# Patient Record
Sex: Female | Born: 1935 | Race: Black or African American | Hispanic: No | Smoking: Never smoker
Health system: Southern US, Community
[De-identification: ages and names within clinical notes are randomized; demographics above are authoritative.]

## PROBLEM LIST (undated history)

## (undated) DIAGNOSIS — E785 Hyperlipidemia, unspecified: Secondary | ICD-10-CM

## (undated) DIAGNOSIS — F0390 Unspecified dementia without behavioral disturbance: Secondary | ICD-10-CM

## (undated) DIAGNOSIS — K219 Gastro-esophageal reflux disease without esophagitis: Secondary | ICD-10-CM

## (undated) HISTORY — DX: Gastro-esophageal reflux disease without esophagitis: K21.9

## (undated) HISTORY — PX: CHOLECYSTECTOMY: SHX55

## (undated) HISTORY — DX: Hyperlipidemia, unspecified: E78.5

## (undated) HISTORY — PX: ABDOMINAL HYSTERECTOMY: SHX81

## (undated) HISTORY — DX: Unspecified dementia, unspecified severity, without behavioral disturbance, psychotic disturbance, mood disturbance, and anxiety: F03.90

---

## 2001-04-15 ENCOUNTER — Ambulatory Visit (HOSPITAL_COMMUNITY): Admission: RE | Admit: 2001-04-15 | Discharge: 2001-04-15 | Payer: Self-pay | Admitting: Internal Medicine

## 2001-04-15 ENCOUNTER — Encounter: Payer: Self-pay | Admitting: Internal Medicine

## 2001-05-26 ENCOUNTER — Other Ambulatory Visit: Admission: RE | Admit: 2001-05-26 | Discharge: 2001-05-26 | Payer: Self-pay | Admitting: Internal Medicine

## 2002-04-26 ENCOUNTER — Encounter: Payer: Self-pay | Admitting: Internal Medicine

## 2002-04-26 ENCOUNTER — Ambulatory Visit (HOSPITAL_COMMUNITY): Admission: RE | Admit: 2002-04-26 | Discharge: 2002-04-26 | Payer: Self-pay | Admitting: Internal Medicine

## 2003-05-11 ENCOUNTER — Ambulatory Visit (HOSPITAL_COMMUNITY): Admission: RE | Admit: 2003-05-11 | Discharge: 2003-05-11 | Payer: Self-pay | Admitting: Internal Medicine

## 2004-05-22 ENCOUNTER — Ambulatory Visit (HOSPITAL_COMMUNITY): Admission: RE | Admit: 2004-05-22 | Discharge: 2004-05-22 | Payer: Self-pay | Admitting: Internal Medicine

## 2005-12-19 ENCOUNTER — Ambulatory Visit (HOSPITAL_COMMUNITY): Admission: RE | Admit: 2005-12-19 | Discharge: 2005-12-19 | Payer: Self-pay | Admitting: Obstetrics & Gynecology

## 2006-12-22 ENCOUNTER — Ambulatory Visit (HOSPITAL_COMMUNITY): Admission: RE | Admit: 2006-12-22 | Discharge: 2006-12-22 | Payer: Self-pay | Admitting: Obstetrics & Gynecology

## 2007-12-24 ENCOUNTER — Ambulatory Visit (HOSPITAL_COMMUNITY): Admission: RE | Admit: 2007-12-24 | Discharge: 2007-12-24 | Payer: Self-pay | Admitting: Obstetrics & Gynecology

## 2008-12-26 ENCOUNTER — Ambulatory Visit (HOSPITAL_COMMUNITY): Admission: RE | Admit: 2008-12-26 | Discharge: 2008-12-26 | Payer: Self-pay | Admitting: Obstetrics & Gynecology

## 2009-12-28 ENCOUNTER — Ambulatory Visit (HOSPITAL_COMMUNITY): Admission: RE | Admit: 2009-12-28 | Discharge: 2009-12-28 | Payer: Self-pay | Admitting: Obstetrics & Gynecology

## 2010-06-30 ENCOUNTER — Encounter: Payer: Self-pay | Admitting: Internal Medicine

## 2010-11-23 ENCOUNTER — Other Ambulatory Visit: Payer: Self-pay | Admitting: Obstetrics & Gynecology

## 2010-11-23 DIAGNOSIS — Z139 Encounter for screening, unspecified: Secondary | ICD-10-CM

## 2011-01-04 ENCOUNTER — Ambulatory Visit (HOSPITAL_COMMUNITY)
Admission: RE | Admit: 2011-01-04 | Discharge: 2011-01-04 | Disposition: A | Discharge status: Home / Self Care | Payer: Medicare Other | Source: Ambulatory Visit | Attending: Obstetrics & Gynecology | Admitting: Obstetrics & Gynecology

## 2011-01-04 DIAGNOSIS — Z1231 Encounter for screening mammogram for malignant neoplasm of breast: Secondary | ICD-10-CM | POA: Insufficient documentation

## 2011-01-04 DIAGNOSIS — Z139 Encounter for screening, unspecified: Secondary | ICD-10-CM

## 2011-11-26 ENCOUNTER — Other Ambulatory Visit: Payer: Self-pay | Admitting: Obstetrics & Gynecology

## 2011-11-26 DIAGNOSIS — Z139 Encounter for screening, unspecified: Secondary | ICD-10-CM

## 2012-01-06 ENCOUNTER — Ambulatory Visit (HOSPITAL_COMMUNITY)
Admission: RE | Admit: 2012-01-06 | Discharge: 2012-01-06 | Disposition: A | Discharge status: Home / Self Care | Payer: Medicare Other | Source: Ambulatory Visit | Attending: Obstetrics & Gynecology | Admitting: Obstetrics & Gynecology

## 2012-01-06 DIAGNOSIS — Z1231 Encounter for screening mammogram for malignant neoplasm of breast: Secondary | ICD-10-CM | POA: Insufficient documentation

## 2012-01-06 DIAGNOSIS — Z139 Encounter for screening, unspecified: Secondary | ICD-10-CM

## 2013-02-04 ENCOUNTER — Encounter: Payer: Self-pay | Admitting: General Practice

## 2013-02-04 ENCOUNTER — Ambulatory Visit (INDEPENDENT_AMBULATORY_CARE_PROVIDER_SITE_OTHER): Payer: Medicare Other | Admitting: General Practice

## 2013-02-04 VITALS — BP 152/87 | HR 66 | Temp 97.2°F | Ht 67.0 in | Wt 157.0 lb

## 2013-02-04 DIAGNOSIS — F99 Mental disorder, not otherwise specified: Secondary | ICD-10-CM

## 2013-02-04 DIAGNOSIS — R5381 Other malaise: Secondary | ICD-10-CM

## 2013-02-04 DIAGNOSIS — F0390 Unspecified dementia without behavioral disturbance: Secondary | ICD-10-CM

## 2013-02-04 DIAGNOSIS — R4182 Altered mental status, unspecified: Secondary | ICD-10-CM

## 2013-02-04 DIAGNOSIS — Z0000 Encounter for general adult medical examination without abnormal findings: Secondary | ICD-10-CM

## 2013-02-04 DIAGNOSIS — R6889 Other general symptoms and signs: Secondary | ICD-10-CM

## 2013-02-04 LAB — POCT CBC
Granulocyte percent: 58.5 %G (ref 37–80)
Hemoglobin: 13.4 g/dL (ref 12.2–16.2)
MCHC: 33.6 g/dL (ref 31.8–35.4)
MPV: 8 fL (ref 0–99.8)
POC Granulocyte: 1.8 — AB (ref 2–6.9)
POC LYMPH PERCENT: 38.9 %L (ref 10–50)

## 2013-02-04 MED ORDER — DONEPEZIL HCL 5 MG PO TABS: ORAL_TABLET | ORAL | Status: DC

## 2013-02-04 MED ORDER — DONEPEZIL HCL 5 MG PO TABS: 5.0000 mg | ORAL_TABLET | Freq: Every evening | ORAL | Status: DC | PRN

## 2013-02-04 NOTE — Patient Instructions (Signed)
Dementia Dementia is a general term for problems with brain function. A person with dementia has memory loss and a hard time with at least one other brain function such as thinking, speaking, or problem solving. Dementia can affect social functioning, how you do your job, your mood, or your personality. The changes may be hidden for a long time. The earliest forms of this disease are usually not detected by family or friends. Dementia can be:  Irreversible.  Potentially reversible.  Partially reversible.  Progressive. This means it can get worse over time. CAUSES  Irreversible dementia causes may include:  Degeneration of brain cells (Alzheimer's disease or lewy body dementia).  Multiple small strokes (vascular dementia).  Infection (chronic meningitis or Creutzfelt-Jakob disease).  Frontotemporal dementia. This affects younger people, age 40 to 70, compared to those who have Alzheimer's disease.  Dementia associated with other disorders like Parkinson's disease, Huntington's disease, or HIV-associated dementia. Potentially or partially reversible dementia causes may include:  Medicines.  Metabolic causes such as excessive alcohol intake, vitamin B12 deficiency, or thyroid disease.  Masses or pressure in the brain such as a tumor, blood clot, or hydrocephalus. SYMPTOMS  Symptoms are often hard to detect. Family members or coworkers may not notice them early in the disease process. Different people with dementia may have different symptoms. Symptoms can include:  A hard time with memory, especially recent memory. Long-term memory may not be impaired.  Asking the same question multiple times or forgetting something someone just said.  A hard time speaking your thoughts or finding certain words.  A hard time solving problems or performing familiar tasks (such as how to use a telephone).  Sudden changes in mood.  Changes in personality, especially increasing moodiness or  mistrust.  Depression.  A hard time understanding complex ideas that were never a problem in the past. DIAGNOSIS  There are no specific tests for dementia.   Your caregiver may recommend a thorough evaluation. This is because some forms of dementia can be reversible. The evaluation will likely include a physical exam and getting a detailed history from you and a family member. The history often gives the best clues and suggestions for a diagnosis.  Memory testing may be done. A detailed brain function evaluation called neuropsychologic testing may be helpful.  Lab tests and brain imaging (such as a CT scan or MRI scan) are sometimes important.  Sometimes observation and re-evaluation over time is very helpful. TREATMENT  Treatment depends on the cause.   If the problem is a vitamin deficiency, it may be helped or cured with supplements.  For dementias such as Alzheimer's disease, medicines are available to stabilize or slow the course of the disease. There are no cures for this type of dementia.  Your caregiver can help direct you to groups, organizations, and other caregivers to help with decisions in the care of you or your loved one. HOME CARE INSTRUCTIONS The care of individuals with dementia is varied and dependent upon the progression of the dementia. The following suggestions are intended for the person living with, or caring for, the person with dementia.  Create a safe environment.  Remove the locks on bathroom doors to prevent the person from accidentally locking himself or herself in.  Use childproof latches on kitchen cabinets and any place where cleaning supplies, chemicals, or alcohol are kept.  Use childproof covers in unused electrical outlets.  Install childproof devices to keep doors and windows secured.  Remove stove knobs or install safety   knobs and an automatic shut-off on the stove.  Lower the temperature on water heaters.  Label medicines and keep them  locked up.  Secure knives, lighters, matches, power tools, and guns, and keep these items out of reach.  Keep the house free from clutter. Remove rugs or anything that might contribute to a fall.  Remove objects that might break and hurt the person.  Make sure lighting is good, both inside and outside.  Install grab rails as needed.  Use a monitoring device to alert you to falls or other needs for help.  Reduce confusion.  Keep familiar objects and people around.  Use night lights or dim lights at night.  Label items or areas.  Use reminders, notes, or directions for daily activities or tasks.  Keep a simple, consistent routine for waking, meals, bathing, dressing, and bedtime.  Create a calm, quiet environment.  Place large clocks and calendars prominently.  Display emergency numbers and home address near all telephones.  Use cues to establish different times of the day. An example is to open curtains to let the natural light in during the day.   Use effective communication.  Choose simple words and short sentences.  Use a gentle, calm tone of voice.  Be careful not to interrupt.  If the person is struggling to find a word or communicate a thought, try to provide the word or thought.  Ask one question at a time. Allow the person ample time to answer questions. Repeat the question again if the person does not respond.  Reduce nighttime restlessness.  Provide a comfortable bed.  Have a consistent nighttime routine.  Ensure a regular walking or physical activity schedule. Involve the person in daily activities as much as possible.  Limit napping during the day.  Limit caffeine.  Attend social events that stimulate rather than overwhelm the senses.  Encourage good nutrition and hydration.  Reduce distractions during meal times and snacks.  Avoid foods that are too hot or too cold.  Monitor chewing and swallowing ability.  Continue with routine vision,  hearing, dental, and medical screenings.  Only give over-the-counter or prescription medicines as directed by the caregiver.  Monitor driving abilities. Do not allow the person to drive when safe driving is no longer possible.  Register with an identification program which could provide location assistance in the event of a missing person situation. SEEK MEDICAL CARE IF:   New behavioral problems start such as moodiness, aggressiveness, or seeing things that are not there (hallucinations).  Any new problem with brain function happens. This includes problems with balance, speech, or falling a lot.  Problems with swallowing develop.  Any symptoms of other illness happen. Small changes or worsening in any aspect of brain function can be a sign that the illness is getting worse. It can also be a sign of another medical illness such as infection. Seeing a caregiver right away is important. SEEK IMMEDIATE MEDICAL CARE IF:   A fever develops.  New or worsened confusion develops.  New or worsened sleepiness develops.  Staying awake becomes hard to do. Document Released: 11/20/2000 Document Revised: 08/19/2011 Document Reviewed: 10/22/2010 [employer] Patient Information 2014 [employer].  

## 2013-02-04 NOTE — Progress Notes (Signed)
  Subjective:    Patient ID: Kylie Clark, female    DOB: 18-Sep-1931, 79 y.o.   MRN: [id number]  HPI Patient presents today for a physical exam. She denies being seen by a provider in many years, she is unable to remember exact time. She is here also for dementia evaluation and accompanied by her daughter. Patient reports feeling more cold (wearing jackets during hot weather), forgetful, loosing items, lack of paying bills, and concentration is poor. Her daughter reports she talks about things in the past, when discussing present topics. She reports getting agitated when unable to remember. She denies taking any prescribed medications. Patient seems to be unable to complete a full sentence, when trying to express self. It seems she forgets the words. Her daughter is able to help her complete sentences.     Review of Systems  Constitutional: Positive for chills. Negative for fever.  HENT: Negative for hearing loss, ear pain and neck stiffness.        Has swollen glands at times, but not present today  Eyes: Negative for pain.  Respiratory: Negative for chest tightness and shortness of breath.   Cardiovascular: Negative for chest pain and palpitations.  Gastrointestinal: Negative for vomiting, abdominal pain, diarrhea and blood in stool.  Endocrine: Positive for cold intolerance.  Genitourinary: Negative for dysuria, hematuria and difficulty urinating.  Musculoskeletal: Negative for back pain.  Neurological: Negative for dizziness, seizures, weakness, numbness and headaches.       Objective:   Physical Exam  Constitutional: She is oriented to person, place, and time. She appears well-developed and well-nourished.  HENT:  Head: Normocephalic and atraumatic.  Right Ear: External ear normal.  Left Ear: External ear normal.  Nose: Nose normal.  Mouth/Throat: Oropharynx is clear and moist.  Eyes: EOM are normal. Pupils are equal, round, and reactive to light.  Neck: Normal range of  motion. Neck supple. No thyromegaly present.  Cardiovascular: Normal rate, regular rhythm and normal heart sounds.   Pulmonary/Chest: Effort normal and breath sounds normal.  Abdominal: Soft. Bowel sounds are normal. She exhibits no distension. There is no tenderness.  Musculoskeletal: She exhibits no edema and no tenderness.  Neurological: She is alert and oriented to person, place, and time.  Skin: Skin is warm and dry.  Psychiatric: She has a normal mood and affect.          Assessment & Plan:  1. Abnormal MMSE -scored 15 MMSE -discussed results with patient and family  2. Annual physical exam - POCT CBC - CMP14+EGFR - NMR, lipoprofile  3. Dementia - donepezil (ARICEPT) 5 MG tablet; Take one tablet by mouth every night  Dispense: 30 tablet; Refill: 1  4. Cold intolerance  - Thyroid Panel With TSH  5. Other malaise and fatigue - Thyroid Panel With TSH - Vitamin B12 -discussed safety and fall precautions -RTO if symptoms worsen -Patient's  Daughter verbalized understanding -Coralie Keens, FNP-C

## 2013-02-05 LAB — CMP14+EGFR
ALT: 8 IU/L (ref 0–32)
BUN: 9 mg/dL (ref 8–27)
Calcium: 9.5 mg/dL (ref 8.6–10.2)
Chloride: 101 mmol/L (ref 97–108)
GFR calc non Af Amer: 52 mL/min/{1.73_m2} — ABNORMAL LOW (ref >59)
Glucose: 87 mg/dL (ref 65–99)
Potassium: 4.3 mmol/L (ref 3.5–5.2)
Total Protein: 6.9 g/dL (ref 6.0–8.5)

## 2013-02-05 LAB — VITAMIN B12: Vitamin B-12: 882 pg/mL (ref 211–946)

## 2013-02-05 LAB — NMR, LIPOPROFILE
HDL Cholesterol by NMR: 56 mg/dL (ref >=40)
LDL Particle Number: 1797 nmol/L — ABNORMAL HIGH (ref <1000)
Triglycerides by NMR: 96 mg/dL (ref <150)

## 2013-02-05 LAB — THYROID PANEL WITH TSH
Free Thyroxine Index: 1.4 (ref 1.2–4.9)
T3 Uptake Ratio: 23 % — ABNORMAL LOW (ref 24–39)
T4, Total: 6.1 ug/dL (ref 4.5–12.0)

## 2013-02-11 ENCOUNTER — Telehealth: Payer: Self-pay | Admitting: General Practice

## 2013-02-11 NOTE — Telephone Encounter (Signed)
Please review

## 2013-02-15 ENCOUNTER — Other Ambulatory Visit: Payer: Self-pay | Admitting: General Practice

## 2013-02-15 DIAGNOSIS — E785 Hyperlipidemia, unspecified: Secondary | ICD-10-CM

## 2013-02-15 MED ORDER — ATORVASTATIN CALCIUM 20 MG PO TABS: 20.0000 mg | ORAL_TABLET | Freq: Every day | ORAL | Status: DC

## 2013-02-18 ENCOUNTER — Other Ambulatory Visit: Payer: Self-pay | Admitting: General Practice

## 2013-02-18 DIAGNOSIS — F0390 Unspecified dementia without behavioral disturbance: Secondary | ICD-10-CM

## 2013-02-19 ENCOUNTER — Encounter: Payer: Self-pay | Admitting: General Practice

## 2013-02-19 NOTE — Telephone Encounter (Signed)
Spoke with patient's daughter and informed that home health referral has been made. Marylouise Stacks, patient's daughter informed provider that patient didn't pass last driver's license renewal test. Marylouise Stacks, would like a letter stating that dementia could affect patient's) ability to properly operate a vehicle (due to patient requesting to get car repaired). Letter will be sent to patient and daughter.

## 2013-02-25 ENCOUNTER — Telehealth: Payer: Self-pay | Admitting: General Practice

## 2013-02-25 DIAGNOSIS — F0390 Unspecified dementia without behavioral disturbance: Secondary | ICD-10-CM

## 2013-02-25 MED ORDER — DONEPEZIL HCL 5 MG PO TABS: ORAL_TABLET | ORAL | Status: DC

## 2013-02-25 NOTE — Telephone Encounter (Signed)
aricept rx sent to pharmacy- need to stay on meds

## 2013-03-12 ENCOUNTER — Encounter: Payer: Self-pay | Admitting: General Practice

## 2013-03-12 ENCOUNTER — Ambulatory Visit (INDEPENDENT_AMBULATORY_CARE_PROVIDER_SITE_OTHER): Payer: Medicare Other | Admitting: General Practice

## 2013-03-12 VITALS — BP 133/84 | HR 74 | Temp 97.2°F | Ht 67.0 in | Wt 158.0 lb

## 2013-03-12 DIAGNOSIS — F0390 Unspecified dementia without behavioral disturbance: Secondary | ICD-10-CM

## 2013-03-12 DIAGNOSIS — K219 Gastro-esophageal reflux disease without esophagitis: Secondary | ICD-10-CM

## 2013-03-12 DIAGNOSIS — E785 Hyperlipidemia, unspecified: Secondary | ICD-10-CM

## 2013-03-12 MED ORDER — OMEPRAZOLE 20 MG PO CPDR: 20.0000 mg | DELAYED_RELEASE_CAPSULE | Freq: Every day | ORAL | Status: DC

## 2013-03-12 MED ORDER — DONEPEZIL HCL 10 MG PO TABS: ORAL_TABLET | ORAL | Status: DC

## 2013-03-12 NOTE — Progress Notes (Signed)
  Subjective:    Patient ID: Kylie Clark, female    DOB: Nov 09, 1931, 79 y.o.   MRN: [id number]  HPI Patient return today for follow up of Dementia. She was started was aricept. Patient daughter  Burna Mortimer) verbalized that patient doesn't seem any worse than previous visit. Burna Mortimer verbalized that a home health agency will be adding in the care of her mother. Burna Mortimer reports she and her brother are also viewing assisted living facilities to determine which they would like to use if necessary.  Patient is attempting to communicate with provider, but at times unable to complete her thoughts. Patient is pleasant, non agitated, and seems to understand that her children are trying to help care for and maintain her safety.  Patient's daughter reports patient has a history of acid reflux (belching with burning sensation after eating at times). Also reports that patient hasn't picked up lipitor from pharmacy yet.    Review of Systems  Constitutional: Negative for fever and chills.  HENT: Negative for hearing loss, ear pain and neck stiffness.   Eyes: Negative for pain.  Respiratory: Negative for chest tightness and shortness of breath.   Cardiovascular: Negative for chest pain and palpitations.  Gastrointestinal: Negative for vomiting, abdominal pain, diarrhea and blood in stool.  Genitourinary: Negative for dysuria, hematuria and difficulty urinating.  Musculoskeletal: Negative for back pain.  Neurological: Negative for dizziness, seizures, weakness, numbness and headaches.       Objective:   Physical Exam  Constitutional: She is oriented to person, place, and time. She appears well-developed and well-nourished.  HENT:  Head: Normocephalic and atraumatic.  Right Ear: External ear normal.  Left Ear: External ear normal.  Nose: Nose normal.  Mouth/Throat: Oropharynx is clear and moist.  Eyes: EOM are normal. Pupils are equal, round, and reactive to light.  Neck: Normal range of motion. Neck supple.  No thyromegaly present.  Cardiovascular: Normal rate, regular rhythm and normal heart sounds.   Pulmonary/Chest: Effort normal and breath sounds normal.  Abdominal: Soft. Bowel sounds are normal. She exhibits no distension. There is no tenderness.  Musculoskeletal: She exhibits no edema and no tenderness.  Neurological: She is alert and oriented to person, place, and time.  Skin: Skin is warm and dry.  Psychiatric: She has a normal mood and affect.          Assessment & Plan:  1. Dementia  - donepezil (ARICEPT) 10 MG tablet; Take one tablet by mouth every night  Dispense: 30 tablet; Refill: 3  2. GERD (gastroesophageal reflux disease)  - omeprazole (PRILOSEC) 20 MG capsule; Take 1 capsule (20 mg total) by mouth daily.  Dispense: 30 capsule; Refill: 3 -discussed gerd diet  3. Other and unspecified hyperlipidemia -discussed importance of taking medications as prescribed -RTO in 3 months for follow up and sooner if needed -Patient's daughter verbalized understanding -Coralie Keens, FNP-C

## 2013-04-15 ENCOUNTER — Other Ambulatory Visit: Payer: Self-pay

## 2013-04-19 ENCOUNTER — Encounter: Payer: Self-pay | Admitting: General Practice

## 2013-04-19 ENCOUNTER — Ambulatory Visit (INDEPENDENT_AMBULATORY_CARE_PROVIDER_SITE_OTHER): Payer: Medicare Other | Admitting: General Practice

## 2013-04-19 VITALS — BP 143/84 | HR 58 | Temp 97.3°F | Ht 67.0 in | Wt 162.0 lb

## 2013-04-19 DIAGNOSIS — R110 Nausea: Secondary | ICD-10-CM

## 2013-04-19 DIAGNOSIS — F0390 Unspecified dementia without behavioral disturbance: Secondary | ICD-10-CM

## 2013-04-19 DIAGNOSIS — E785 Hyperlipidemia, unspecified: Secondary | ICD-10-CM

## 2013-04-19 NOTE — Progress Notes (Signed)
  Subjective:    Patient ID: Kylie Clark, female    DOB: 1931-09-23, 79 y.o.   MRN: [id number]  HPI Patient presents today with complaints of medication (omeprazole) making her nauseated. Patient was recently diagnosed with dementia and is accompanied by her caregiver, today. Patient denies taking any medications for past week. She was prescribed atorvastatin, donepezil, and omeprazole. She refuses to continue omeprazole, denies having episodes of chest pain or acid reflux. Patient has difficulty effectively expressing her thoughts.     Review of Systems  Constitutional: Negative for fever and chills.  Eyes: Negative for pain.  Respiratory: Negative for chest tightness and shortness of breath.   Cardiovascular: Negative for chest pain and palpitations.  Gastrointestinal: Negative for nausea, vomiting, abdominal pain, diarrhea and blood in stool.       Nauseated while taking omeprazole  Genitourinary: Negative for dysuria, hematuria and difficulty urinating.  Neurological: Negative for dizziness and headaches.       Objective:   Physical Exam  Constitutional: She is oriented to person, place, and time. She appears well-developed and well-nourished.  Cardiovascular: Normal rate, regular rhythm and normal heart sounds.   Pulmonary/Chest: Effort normal and breath sounds normal. No respiratory distress. She exhibits no tenderness.  Abdominal: Soft. Bowel sounds are normal. She exhibits no distension. There is no tenderness.  Neurological: She is alert and oriented to person, place, and time.  Skin: Skin is warm and dry.  Psychiatric: She has a normal mood and affect.          Assessment & Plan:  1. Dementia -continue current medication  2. Hyperlipidemia -Continue current medication  3. Nausea -resolved after discontinuing omeprazole -stop omeprazole -RTO if symptoms of acid reflux develop -Patient and caregiver verbalized understanding Coralie Keens, FNP-C

## 2013-04-19 NOTE — Patient Instructions (Addendum)
Dementia Dementia is a general term for problems with brain function. A person with dementia has memory loss and a hard time with at least one other brain function such as thinking, speaking, or problem solving. Dementia can affect social functioning, how you do your job, your mood, or your personality. The changes may be hidden for a long time. The earliest forms of this disease are usually not detected by family or friends. Dementia can be:  Irreversible.  Potentially reversible.  Partially reversible.  Progressive. This means it can get worse over time. CAUSES  Irreversible dementia causes may include:  Degeneration of brain cells (Alzheimer's disease or lewy body dementia).  Multiple small strokes (vascular dementia).  Infection (chronic meningitis or Creutzfelt-Jakob disease).  Frontotemporal dementia. This affects younger people, age 40 to 70, compared to those who have Alzheimer's disease.  Dementia associated with other disorders like Parkinson's disease, Huntington's disease, or HIV-associated dementia. Potentially or partially reversible dementia causes may include:  Medicines.  Metabolic causes such as excessive alcohol intake, vitamin B12 deficiency, or thyroid disease.  Masses or pressure in the brain such as a tumor, blood clot, or hydrocephalus. SYMPTOMS  Symptoms are often hard to detect. Family members or coworkers may not notice them early in the disease process. Different people with dementia may have different symptoms. Symptoms can include:  A hard time with memory, especially recent memory. Long-term memory may not be impaired.  Asking the same question multiple times or forgetting something someone just said.  A hard time speaking your thoughts or finding certain words.  A hard time solving problems or performing familiar tasks (such as how to use a telephone).  Sudden changes in mood.  Changes in personality, especially increasing moodiness or  mistrust.  Depression.  A hard time understanding complex ideas that were never a problem in the past. DIAGNOSIS  There are no specific tests for dementia.   Your caregiver may recommend a thorough evaluation. This is because some forms of dementia can be reversible. The evaluation will likely include a physical exam and getting a detailed history from you and a family member. The history often gives the best clues and suggestions for a diagnosis.  Memory testing may be done. A detailed brain function evaluation called neuropsychologic testing may be helpful.  Lab tests and brain imaging (such as a CT scan or MRI scan) are sometimes important.  Sometimes observation and re-evaluation over time is very helpful. TREATMENT  Treatment depends on the cause.   If the problem is a vitamin deficiency, it may be helped or cured with supplements.  For dementias such as Alzheimer's disease, medicines are available to stabilize or slow the course of the disease. There are no cures for this type of dementia.  Your caregiver can help direct you to groups, organizations, and other caregivers to help with decisions in the care of you or your loved one. HOME CARE INSTRUCTIONS The care of individuals with dementia is varied and dependent upon the progression of the dementia. The following suggestions are intended for the person living with, or caring for, the person with dementia.  Create a safe environment.  Remove the locks on bathroom doors to prevent the person from accidentally locking himself or herself in.  Use childproof latches on kitchen cabinets and any place where cleaning supplies, chemicals, or alcohol are kept.  Use childproof covers in unused electrical outlets.  Install childproof devices to keep doors and windows secured.  Remove stove knobs or install safety   knobs and an automatic shut-off on the stove.  Lower the temperature on water heaters.  Label medicines and keep them  locked up.  Secure knives, lighters, matches, power tools, and guns, and keep these items out of reach.  Keep the house free from clutter. Remove rugs or anything that might contribute to a fall.  Remove objects that might break and hurt the person.  Make sure lighting is good, both inside and outside.  Install grab rails as needed.  Use a monitoring device to alert you to falls or other needs for help.  Reduce confusion.  Keep familiar objects and people around.  Use night lights or dim lights at night.  Label items or areas.  Use reminders, notes, or directions for daily activities or tasks.  Keep a simple, consistent routine for waking, meals, bathing, dressing, and bedtime.  Create a calm, quiet environment.  Place large clocks and calendars prominently.  Display emergency numbers and home address near all telephones.  Use cues to establish different times of the day. An example is to open curtains to let the natural light in during the day.   Use effective communication.  Choose simple words and short sentences.  Use a gentle, calm tone of voice.  Be careful not to interrupt.  If the person is struggling to find a word or communicate a thought, try to provide the word or thought.  Ask one question at a time. Allow the person ample time to answer questions. Repeat the question again if the person does not respond.  Reduce nighttime restlessness.  Provide a comfortable bed.  Have a consistent nighttime routine.  Ensure a regular walking or physical activity schedule. Involve the person in daily activities as much as possible.  Limit napping during the day.  Limit caffeine.  Attend social events that stimulate rather than overwhelm the senses.  Encourage good nutrition and hydration.  Reduce distractions during meal times and snacks.  Avoid foods that are too hot or too cold.  Monitor chewing and swallowing ability.  Continue with routine vision,  hearing, dental, and medical screenings.  Only give over-the-counter or prescription medicines as directed by the caregiver.  Monitor driving abilities. Do not allow the person to drive when safe driving is no longer possible.  Register with an identification program which could provide location assistance in the event of a missing person situation. SEEK MEDICAL CARE IF:   New behavioral problems start such as moodiness, aggressiveness, or seeing things that are not there (hallucinations).  Any new problem with brain function happens. This includes problems with balance, speech, or falling a lot.  Problems with swallowing develop.  Any symptoms of other illness happen. Small changes or worsening in any aspect of brain function can be a sign that the illness is getting worse. It can also be a sign of another medical illness such as infection. Seeing a caregiver right away is important. SEEK IMMEDIATE MEDICAL CARE IF:   A fever develops.  New or worsened confusion develops.  New or worsened sleepiness develops.  Staying awake becomes hard to do. Document Released: 11/20/2000 Document Revised: 08/19/2011 Document Reviewed: 10/22/2010 [facility] Patient Information 2014 [place], [place].   Donepezil tablets What is this medicine? DONEPEZIL (doe NEP e zil) is used to treat mild to moderate dementia caused by Alzheimer's disease. This medicine may be used for other purposes; ask your health care provider or pharmacist if you have questions. COMMON BRAND NAME(S): Aricept What should I tell my health  care provider before I take this medicine? They need to know if you have any of these conditions: -asthma or other lung disease -difficulty passing urine -head injury -heart disease, slow heartbeat -liver disease -Parkinson's disease -seizures (convulsions) -stomach or intestinal disease, ulcers or stomach bleeding -an unusual or allergic reaction to donepezil, other medicines, foods,  dyes, or preservatives -pregnant or trying to get pregnant -breast-feeding How should I use this medicine? Take this medicine by mouth with a glass of water. Follow the directions on the prescription label. You may take this medicine with or without food. Take this medicine at regular intervals. This medicine is usually taken before bedtime. Do not take it more often than directed. Continue to take your medicine even if you feel better. Do not stop taking except on your doctor's advice. If you are taking the 23 mg donepezil tablet, swallow it whole; do not cut, crush, or chew it. Talk to your pediatrician regarding the use of this medicine in children. Special care may be needed. Overdosage: If you think you have taken too much of this medicine contact a poison control center or emergency room at once. NOTE: This medicine is only for you. Do not share this medicine with others. What if I miss a dose? If you miss a dose, take it as soon as you can. If it is almost time for your next dose, take only that dose, do not take double or extra doses. What may interact with this medicine? -atropine -benztropine -bethanechol -carbamazepine -dexamethasone -dicyclomine -glycopyrrolate -hyoscyamine -ipratropium -itraconazole or ketoconazole -medicines for motion sickness -NSAIDs, medicines for pain and inflammation, like ibuprofen or naproxen -other medicines for Alzheimer's disease -oxybutynin -phenobarbital -phenytoin -quinidine -rifampin, rifabutin or rifapentine -trihexyphenidyl This list may not describe all possible interactions. Give your health care provider a list of all the medicines, herbs, non-prescription drugs, or dietary supplements you use. Also tell them if you smoke, drink alcohol, or use illegal drugs. Some items may interact with your medicine. What should I watch for while using this medicine? Visit your doctor or health care professional for regular checks on your progress.  Check with your doctor or health care professional if your symptoms do not get better or if they get worse. You may get drowsy or dizzy. Do not drive, use machinery, or do anything that needs mental alertness until you know how this drug affects you. What side effects may I notice from receiving this medicine? Side effects that you should report to your doctor or health care professional as soon as possible: -allergic reactions like skin rash, itching or hives, swelling of the face, lips, or tongue -changes in vision -feeling faint or lightheaded, falls -problems with balance -slow heartbeat, or palpitations -stomach pain -unusual bleeding or bruising, red or purple spots on the skin -vomiting -weight loss Side effects that usually do not require medical attention (report to your doctor or health care professional if they continue or are bothersome): -diarrhea, especially when starting treatment -headache -indigestion or heartburn -loss of appetite -muscle cramps -nausea This list may not describe all possible side effects. Call your doctor for medical advice about side effects. You may report side effects to FDA at 1-800-FDA-1088. Where should I keep my medicine? Keep out of reach of children. Store at room temperature between 15 and 30 degrees C (59 and 86 degrees F). Throw away any unused medicine after the expiration date. NOTE: This sheet is a summary. It may not cover all possible information. If you have  questions about this medicine, talk to your doctor, pharmacist, or health care provider.  2014, Elsevier/Gold Standard. (2011-11-26 13:27:43)

## 2013-06-18 ENCOUNTER — Encounter: Payer: Self-pay | Admitting: Family Medicine

## 2013-06-18 ENCOUNTER — Ambulatory Visit (INDEPENDENT_AMBULATORY_CARE_PROVIDER_SITE_OTHER): Payer: Medicare Other | Admitting: Family Medicine

## 2013-06-18 ENCOUNTER — Ambulatory Visit: Payer: Medicare Other | Admitting: General Practice

## 2013-06-18 VITALS — BP 147/93 | HR 78 | Temp 96.7°F | Ht 67.0 in | Wt 169.0 lb

## 2013-06-18 DIAGNOSIS — F0390 Unspecified dementia without behavioral disturbance: Secondary | ICD-10-CM | POA: Insufficient documentation

## 2013-06-18 NOTE — Progress Notes (Signed)
   Subjective:    Patient ID: Kylie Clark, female    DOB: 06-01-1932, 80 y.o.   MRN: [id number]  HPI This 80 y.o. female presents for evaluation of routine visit.  She is accompanied by her Health care provider.  Her Health Care Provider states she has a lot of confusion.  She Rambles.  She has problems losing her keys.  She does not take her medicine.  Patient has tangetial Speech and rambles.  She does not make sense with history and is unable to specify why She doesn't take her medicine.  She states "They are not my medicine."     Review of Systems No chest pain, SOB, HA, dizziness, vision change, N/V, diarrhea, constipation, dysuria, urinary urgency or frequency, myalgias, arthralgias or rash.     Objective:   Physical Exam Vital signs noted  Well developed well nourished confused elderly female.  HEENT - Head atraumatic Normocephalic                Eyes - PERRLA, Conjuctiva - clear Sclera- Clear EOMI                Ears - EAC's Wnl TM's Wnl Gross Hearing WNL Respiratory - Lungs CTA bilateral Cardiac - RRR S1 and S2 without murmur Psych - Confused with tangetial speech       Assessment & Plan:  Dementia - I called the Patient's POA, her daughter, and spoke to her about patient needing to  Be in a SNF or a Dementia Nursing home setting. Discussed with family that she would be safer and She would looked after better.  Her daughter agrees and states she is going to have her mother placed In a nursing home either next to her or local.  Lysbeth Penner FNP

## 2013-06-19 NOTE — Patient Instructions (Signed)
Dementia Dementia is a general term for problems with brain function. A person with dementia has memory loss and a hard time with at least one other brain function such as thinking, speaking, or problem solving. Dementia can affect social functioning, how you do your job, your mood, or your personality. The changes may be hidden for a long time. The earliest forms of this disease are usually not detected by family or friends. Dementia can be:  Irreversible.  Potentially reversible.  Partially reversible.  Progressive. This means it can get worse over time. CAUSES  Irreversible dementia causes may include:  Degeneration of brain cells (Alzheimer's disease or lewy body dementia).  Multiple small strokes (vascular dementia).  Infection (chronic meningitis or Creutzfelt-Jakob disease).  Frontotemporal dementia. This affects younger people, age 40 to 70, compared to those who have Alzheimer's disease.  Dementia associated with other disorders like Parkinson's disease, Huntington's disease, or HIV-associated dementia. Potentially or partially reversible dementia causes may include:  Medicines.  Metabolic causes such as excessive alcohol intake, vitamin B12 deficiency, or thyroid disease.  Masses or pressure in the brain such as a tumor, blood clot, or hydrocephalus. SYMPTOMS  Symptoms are often hard to detect. Family members or coworkers may not notice them early in the disease process. Different people with dementia may have different symptoms. Symptoms can include:  A hard time with memory, especially recent memory. Long-term memory may not be impaired.  Asking the same question multiple times or forgetting something someone just said.  A hard time speaking your thoughts or finding certain words.  A hard time solving problems or performing familiar tasks (such as how to use a telephone).  Sudden changes in mood.  Changes in personality, especially increasing moodiness or  mistrust.  Depression.  A hard time understanding complex ideas that were never a problem in the past. DIAGNOSIS  There are no specific tests for dementia.   Your caregiver may recommend a thorough evaluation. This is because some forms of dementia can be reversible. The evaluation will likely include a physical exam and getting a detailed history from you and a family member. The history often gives the best clues and suggestions for a diagnosis.  Memory testing may be done. A detailed brain function evaluation called neuropsychologic testing may be helpful.  Lab tests and brain imaging (such as a CT scan or MRI scan) are sometimes important.  Sometimes observation and re-evaluation over time is very helpful. TREATMENT  Treatment depends on the cause.   If the problem is a vitamin deficiency, it may be helped or cured with supplements.  For dementias such as Alzheimer's disease, medicines are available to stabilize or slow the course of the disease. There are no cures for this type of dementia.  Your caregiver can help direct you to groups, organizations, and other caregivers to help with decisions in the care of you or your loved one. HOME CARE INSTRUCTIONS The care of individuals with dementia is varied and dependent upon the progression of the dementia. The following suggestions are intended for the person living with, or caring for, the person with dementia.  Create a safe environment.  Remove the locks on bathroom doors to prevent the person from accidentally locking himself or herself in.  Use childproof latches on kitchen cabinets and any place where cleaning supplies, chemicals, or alcohol are kept.  Use childproof covers in unused electrical outlets.  Install childproof devices to keep doors and windows secured.  Remove stove knobs or install safety   knobs and an automatic shut-off on the stove.  Lower the temperature on water heaters.  Label medicines and keep them  locked up.  Secure knives, lighters, matches, power tools, and guns, and keep these items out of reach.  Keep the house free from clutter. Remove rugs or anything that might contribute to a fall.  Remove objects that might break and hurt the person.  Make sure lighting is good, both inside and outside.  Install grab rails as needed.  Use a monitoring device to alert you to falls or other needs for help.  Reduce confusion.  Keep familiar objects and people around.  Use night lights or dim lights at night.  Label items or areas.  Use reminders, notes, or directions for daily activities or tasks.  Keep a simple, consistent routine for waking, meals, bathing, dressing, and bedtime.  Create a calm, quiet environment.  Place large clocks and calendars prominently.  Display emergency numbers and home address near all telephones.  Use cues to establish different times of the day. An example is to open curtains to let the natural light in during the day.   Use effective communication.  Choose simple words and short sentences.  Use a gentle, calm tone of voice.  Be careful not to interrupt.  If the person is struggling to find a word or communicate a thought, try to provide the word or thought.  Ask one question at a time. Allow the person ample time to answer questions. Repeat the question again if the person does not respond.  Reduce nighttime restlessness.  Provide a comfortable bed.  Have a consistent nighttime routine.  Ensure a regular walking or physical activity schedule. Involve the person in daily activities as much as possible.  Limit napping during the day.  Limit caffeine.  Attend social events that stimulate rather than overwhelm the senses.  Encourage good nutrition and hydration.  Reduce distractions during meal times and snacks.  Avoid foods that are too hot or too cold.  Monitor chewing and swallowing ability.  Continue with routine vision,  hearing, dental, and medical screenings.  Only give over-the-counter or prescription medicines as directed by the caregiver.  Monitor driving abilities. Do not allow the person to drive when safe driving is no longer possible.  Register with an identification program which could provide location assistance in the event of a missing person situation. SEEK MEDICAL CARE IF:   New behavioral problems start such as moodiness, aggressiveness, or seeing things that are not there (hallucinations).  Any new problem with brain function happens. This includes problems with balance, speech, or falling a lot.  Problems with swallowing develop.  Any symptoms of other illness happen. Small changes or worsening in any aspect of brain function can be a sign that the illness is getting worse. It can also be a sign of another medical illness such as infection. Seeing a caregiver right away is important. SEEK IMMEDIATE MEDICAL CARE IF:   A fever develops.  New or worsened confusion develops.  New or worsened sleepiness develops.  Staying awake becomes hard to do. Document Released: 11/20/2000 Document Revised: 08/19/2011 Document Reviewed: 10/22/2010 [employer] Patient Information 2014 [employer].  

## 2013-06-23 ENCOUNTER — Telehealth: Payer: Self-pay | Admitting: Family Medicine

## 2013-06-23 NOTE — Telephone Encounter (Signed)
I called Wilma and she wanted some information on nursing homes. Barnett Applebaum will speak with her.

## 2013-08-06 ENCOUNTER — Other Ambulatory Visit: Payer: Medicare Other

## 2014-06-10 DIAGNOSIS — G309 Alzheimer's disease, unspecified: Secondary | ICD-10-CM | POA: Diagnosis not present

## 2014-06-11 DIAGNOSIS — G309 Alzheimer's disease, unspecified: Secondary | ICD-10-CM | POA: Diagnosis not present

## 2014-06-12 DIAGNOSIS — G309 Alzheimer's disease, unspecified: Secondary | ICD-10-CM | POA: Diagnosis not present

## 2014-06-13 DIAGNOSIS — G309 Alzheimer's disease, unspecified: Secondary | ICD-10-CM | POA: Diagnosis not present

## 2014-06-14 DIAGNOSIS — G309 Alzheimer's disease, unspecified: Secondary | ICD-10-CM | POA: Diagnosis not present

## 2014-06-15 DIAGNOSIS — G309 Alzheimer's disease, unspecified: Secondary | ICD-10-CM | POA: Diagnosis not present

## 2014-06-16 DIAGNOSIS — G309 Alzheimer's disease, unspecified: Secondary | ICD-10-CM | POA: Diagnosis not present

## 2014-06-17 DIAGNOSIS — G309 Alzheimer's disease, unspecified: Secondary | ICD-10-CM | POA: Diagnosis not present

## 2014-06-18 DIAGNOSIS — G309 Alzheimer's disease, unspecified: Secondary | ICD-10-CM | POA: Diagnosis not present

## 2014-06-19 DIAGNOSIS — G309 Alzheimer's disease, unspecified: Secondary | ICD-10-CM | POA: Diagnosis not present

## 2014-06-20 DIAGNOSIS — G309 Alzheimer's disease, unspecified: Secondary | ICD-10-CM | POA: Diagnosis not present

## 2014-06-21 DIAGNOSIS — D519 Vitamin B12 deficiency anemia, unspecified: Secondary | ICD-10-CM | POA: Diagnosis not present

## 2014-06-21 DIAGNOSIS — E785 Hyperlipidemia, unspecified: Secondary | ICD-10-CM | POA: Diagnosis not present

## 2014-06-21 DIAGNOSIS — G309 Alzheimer's disease, unspecified: Secondary | ICD-10-CM | POA: Diagnosis not present

## 2014-06-21 DIAGNOSIS — E039 Hypothyroidism, unspecified: Secondary | ICD-10-CM | POA: Diagnosis not present

## 2014-06-21 DIAGNOSIS — E1165 Type 2 diabetes mellitus with hyperglycemia: Secondary | ICD-10-CM | POA: Diagnosis not present

## 2014-06-21 DIAGNOSIS — Z0000 Encounter for general adult medical examination without abnormal findings: Secondary | ICD-10-CM | POA: Diagnosis not present

## 2014-06-22 DIAGNOSIS — G309 Alzheimer's disease, unspecified: Secondary | ICD-10-CM | POA: Diagnosis not present

## 2014-06-23 DIAGNOSIS — G309 Alzheimer's disease, unspecified: Secondary | ICD-10-CM | POA: Diagnosis not present

## 2014-06-24 DIAGNOSIS — G309 Alzheimer's disease, unspecified: Secondary | ICD-10-CM | POA: Diagnosis not present

## 2014-06-25 DIAGNOSIS — G309 Alzheimer's disease, unspecified: Secondary | ICD-10-CM | POA: Diagnosis not present

## 2014-06-26 DIAGNOSIS — G309 Alzheimer's disease, unspecified: Secondary | ICD-10-CM | POA: Diagnosis not present

## 2014-06-27 DIAGNOSIS — G309 Alzheimer's disease, unspecified: Secondary | ICD-10-CM | POA: Diagnosis not present

## 2014-06-28 DIAGNOSIS — G309 Alzheimer's disease, unspecified: Secondary | ICD-10-CM | POA: Diagnosis not present

## 2014-06-29 DIAGNOSIS — G309 Alzheimer's disease, unspecified: Secondary | ICD-10-CM | POA: Diagnosis not present

## 2014-06-30 DIAGNOSIS — E785 Hyperlipidemia, unspecified: Secondary | ICD-10-CM | POA: Diagnosis not present

## 2014-06-30 DIAGNOSIS — F0390 Unspecified dementia without behavioral disturbance: Secondary | ICD-10-CM | POA: Diagnosis not present

## 2014-06-30 DIAGNOSIS — K210 Gastro-esophageal reflux disease with esophagitis: Secondary | ICD-10-CM | POA: Diagnosis not present

## 2014-06-30 DIAGNOSIS — K5900 Constipation, unspecified: Secondary | ICD-10-CM | POA: Diagnosis not present

## 2014-06-30 DIAGNOSIS — G309 Alzheimer's disease, unspecified: Secondary | ICD-10-CM | POA: Diagnosis not present

## 2014-07-01 DIAGNOSIS — G309 Alzheimer's disease, unspecified: Secondary | ICD-10-CM | POA: Diagnosis not present

## 2014-07-02 DIAGNOSIS — G309 Alzheimer's disease, unspecified: Secondary | ICD-10-CM | POA: Diagnosis not present

## 2014-07-03 DIAGNOSIS — G309 Alzheimer's disease, unspecified: Secondary | ICD-10-CM | POA: Diagnosis not present

## 2014-07-04 DIAGNOSIS — G309 Alzheimer's disease, unspecified: Secondary | ICD-10-CM | POA: Diagnosis not present

## 2014-07-05 DIAGNOSIS — G309 Alzheimer's disease, unspecified: Secondary | ICD-10-CM | POA: Diagnosis not present

## 2014-07-06 DIAGNOSIS — G309 Alzheimer's disease, unspecified: Secondary | ICD-10-CM | POA: Diagnosis not present

## 2014-07-07 DIAGNOSIS — G309 Alzheimer's disease, unspecified: Secondary | ICD-10-CM | POA: Diagnosis not present

## 2014-07-08 DIAGNOSIS — G309 Alzheimer's disease, unspecified: Secondary | ICD-10-CM | POA: Diagnosis not present

## 2014-07-09 DIAGNOSIS — G309 Alzheimer's disease, unspecified: Secondary | ICD-10-CM | POA: Diagnosis not present

## 2014-07-10 DIAGNOSIS — G309 Alzheimer's disease, unspecified: Secondary | ICD-10-CM | POA: Diagnosis not present

## 2014-07-11 DIAGNOSIS — G309 Alzheimer's disease, unspecified: Secondary | ICD-10-CM | POA: Diagnosis not present

## 2014-07-12 DIAGNOSIS — G309 Alzheimer's disease, unspecified: Secondary | ICD-10-CM | POA: Diagnosis not present

## 2014-07-13 DIAGNOSIS — G309 Alzheimer's disease, unspecified: Secondary | ICD-10-CM | POA: Diagnosis not present

## 2014-07-14 DIAGNOSIS — G309 Alzheimer's disease, unspecified: Secondary | ICD-10-CM | POA: Diagnosis not present

## 2014-07-15 DIAGNOSIS — G309 Alzheimer's disease, unspecified: Secondary | ICD-10-CM | POA: Diagnosis not present

## 2014-07-16 DIAGNOSIS — G309 Alzheimer's disease, unspecified: Secondary | ICD-10-CM | POA: Diagnosis not present

## 2014-07-17 DIAGNOSIS — G309 Alzheimer's disease, unspecified: Secondary | ICD-10-CM | POA: Diagnosis not present

## 2014-07-18 DIAGNOSIS — G309 Alzheimer's disease, unspecified: Secondary | ICD-10-CM | POA: Diagnosis not present

## 2014-07-19 DIAGNOSIS — G309 Alzheimer's disease, unspecified: Secondary | ICD-10-CM | POA: Diagnosis not present

## 2014-07-20 DIAGNOSIS — G309 Alzheimer's disease, unspecified: Secondary | ICD-10-CM | POA: Diagnosis not present

## 2014-07-21 DIAGNOSIS — G309 Alzheimer's disease, unspecified: Secondary | ICD-10-CM | POA: Diagnosis not present

## 2014-07-22 DIAGNOSIS — G309 Alzheimer's disease, unspecified: Secondary | ICD-10-CM | POA: Diagnosis not present

## 2014-07-23 DIAGNOSIS — G309 Alzheimer's disease, unspecified: Secondary | ICD-10-CM | POA: Diagnosis not present

## 2014-07-24 DIAGNOSIS — G309 Alzheimer's disease, unspecified: Secondary | ICD-10-CM | POA: Diagnosis not present

## 2014-07-25 DIAGNOSIS — G309 Alzheimer's disease, unspecified: Secondary | ICD-10-CM | POA: Diagnosis not present

## 2014-07-26 DIAGNOSIS — G309 Alzheimer's disease, unspecified: Secondary | ICD-10-CM | POA: Diagnosis not present

## 2014-07-27 DIAGNOSIS — G309 Alzheimer's disease, unspecified: Secondary | ICD-10-CM | POA: Diagnosis not present

## 2014-07-28 DIAGNOSIS — G309 Alzheimer's disease, unspecified: Secondary | ICD-10-CM | POA: Diagnosis not present

## 2014-07-29 DIAGNOSIS — G309 Alzheimer's disease, unspecified: Secondary | ICD-10-CM | POA: Diagnosis not present

## 2014-07-30 DIAGNOSIS — G309 Alzheimer's disease, unspecified: Secondary | ICD-10-CM | POA: Diagnosis not present

## 2014-07-31 DIAGNOSIS — G309 Alzheimer's disease, unspecified: Secondary | ICD-10-CM | POA: Diagnosis not present

## 2014-08-01 DIAGNOSIS — G309 Alzheimer's disease, unspecified: Secondary | ICD-10-CM | POA: Diagnosis not present

## 2014-08-02 DIAGNOSIS — G309 Alzheimer's disease, unspecified: Secondary | ICD-10-CM | POA: Diagnosis not present

## 2014-08-03 DIAGNOSIS — G309 Alzheimer's disease, unspecified: Secondary | ICD-10-CM | POA: Diagnosis not present

## 2014-08-04 DIAGNOSIS — G309 Alzheimer's disease, unspecified: Secondary | ICD-10-CM | POA: Diagnosis not present

## 2014-08-05 DIAGNOSIS — G309 Alzheimer's disease, unspecified: Secondary | ICD-10-CM | POA: Diagnosis not present

## 2014-08-06 DIAGNOSIS — G309 Alzheimer's disease, unspecified: Secondary | ICD-10-CM | POA: Diagnosis not present

## 2014-08-07 DIAGNOSIS — G309 Alzheimer's disease, unspecified: Secondary | ICD-10-CM | POA: Diagnosis not present

## 2014-08-08 DIAGNOSIS — G309 Alzheimer's disease, unspecified: Secondary | ICD-10-CM | POA: Diagnosis not present

## 2014-08-09 DIAGNOSIS — G309 Alzheimer's disease, unspecified: Secondary | ICD-10-CM | POA: Diagnosis not present

## 2014-08-10 DIAGNOSIS — R0989 Other specified symptoms and signs involving the circulatory and respiratory systems: Secondary | ICD-10-CM | POA: Diagnosis not present

## 2014-08-10 DIAGNOSIS — G309 Alzheimer's disease, unspecified: Secondary | ICD-10-CM | POA: Diagnosis not present

## 2014-08-10 DIAGNOSIS — R221 Localized swelling, mass and lump, neck: Secondary | ICD-10-CM | POA: Diagnosis not present

## 2014-08-10 DIAGNOSIS — I10 Essential (primary) hypertension: Secondary | ICD-10-CM | POA: Diagnosis not present

## 2014-08-10 DIAGNOSIS — I739 Peripheral vascular disease, unspecified: Secondary | ICD-10-CM | POA: Diagnosis not present

## 2014-08-11 DIAGNOSIS — G309 Alzheimer's disease, unspecified: Secondary | ICD-10-CM | POA: Diagnosis not present

## 2014-08-12 DIAGNOSIS — G309 Alzheimer's disease, unspecified: Secondary | ICD-10-CM | POA: Diagnosis not present

## 2014-08-12 DIAGNOSIS — K210 Gastro-esophageal reflux disease with esophagitis: Secondary | ICD-10-CM | POA: Diagnosis not present

## 2014-08-12 DIAGNOSIS — F0390 Unspecified dementia without behavioral disturbance: Secondary | ICD-10-CM | POA: Diagnosis not present

## 2014-08-12 DIAGNOSIS — E785 Hyperlipidemia, unspecified: Secondary | ICD-10-CM | POA: Diagnosis not present

## 2014-08-13 DIAGNOSIS — G309 Alzheimer's disease, unspecified: Secondary | ICD-10-CM | POA: Diagnosis not present

## 2014-08-14 DIAGNOSIS — G309 Alzheimer's disease, unspecified: Secondary | ICD-10-CM | POA: Diagnosis not present

## 2014-08-15 DIAGNOSIS — G309 Alzheimer's disease, unspecified: Secondary | ICD-10-CM | POA: Diagnosis not present

## 2014-08-16 DIAGNOSIS — G309 Alzheimer's disease, unspecified: Secondary | ICD-10-CM | POA: Diagnosis not present

## 2014-08-17 DIAGNOSIS — G309 Alzheimer's disease, unspecified: Secondary | ICD-10-CM | POA: Diagnosis not present

## 2014-08-18 DIAGNOSIS — G309 Alzheimer's disease, unspecified: Secondary | ICD-10-CM | POA: Diagnosis not present

## 2014-08-19 DIAGNOSIS — G309 Alzheimer's disease, unspecified: Secondary | ICD-10-CM | POA: Diagnosis not present

## 2014-08-20 DIAGNOSIS — G309 Alzheimer's disease, unspecified: Secondary | ICD-10-CM | POA: Diagnosis not present

## 2014-08-21 DIAGNOSIS — G309 Alzheimer's disease, unspecified: Secondary | ICD-10-CM | POA: Diagnosis not present

## 2014-08-22 DIAGNOSIS — G309 Alzheimer's disease, unspecified: Secondary | ICD-10-CM | POA: Diagnosis not present

## 2014-08-23 DIAGNOSIS — G309 Alzheimer's disease, unspecified: Secondary | ICD-10-CM | POA: Diagnosis not present

## 2014-08-24 DIAGNOSIS — G309 Alzheimer's disease, unspecified: Secondary | ICD-10-CM | POA: Diagnosis not present

## 2014-08-25 DIAGNOSIS — G309 Alzheimer's disease, unspecified: Secondary | ICD-10-CM | POA: Diagnosis not present

## 2014-08-26 DIAGNOSIS — G309 Alzheimer's disease, unspecified: Secondary | ICD-10-CM | POA: Diagnosis not present

## 2014-08-26 DIAGNOSIS — R109 Unspecified abdominal pain: Secondary | ICD-10-CM | POA: Diagnosis not present

## 2014-08-26 DIAGNOSIS — H6120 Impacted cerumen, unspecified ear: Secondary | ICD-10-CM | POA: Diagnosis not present

## 2014-08-26 DIAGNOSIS — R112 Nausea with vomiting, unspecified: Secondary | ICD-10-CM | POA: Diagnosis not present

## 2014-08-26 DIAGNOSIS — R1310 Dysphagia, unspecified: Secondary | ICD-10-CM | POA: Diagnosis not present

## 2014-08-27 DIAGNOSIS — R109 Unspecified abdominal pain: Secondary | ICD-10-CM | POA: Diagnosis not present

## 2014-08-27 DIAGNOSIS — G309 Alzheimer's disease, unspecified: Secondary | ICD-10-CM | POA: Diagnosis not present

## 2014-08-28 DIAGNOSIS — G309 Alzheimer's disease, unspecified: Secondary | ICD-10-CM | POA: Diagnosis not present

## 2014-08-29 DIAGNOSIS — G309 Alzheimer's disease, unspecified: Secondary | ICD-10-CM | POA: Diagnosis not present

## 2014-08-29 DIAGNOSIS — R1313 Dysphagia, pharyngeal phase: Secondary | ICD-10-CM | POA: Diagnosis not present

## 2014-08-30 DIAGNOSIS — G309 Alzheimer's disease, unspecified: Secondary | ICD-10-CM | POA: Diagnosis not present

## 2014-08-31 DIAGNOSIS — E785 Hyperlipidemia, unspecified: Secondary | ICD-10-CM | POA: Diagnosis not present

## 2014-08-31 DIAGNOSIS — F0390 Unspecified dementia without behavioral disturbance: Secondary | ICD-10-CM | POA: Diagnosis not present

## 2014-08-31 DIAGNOSIS — K219 Gastro-esophageal reflux disease without esophagitis: Secondary | ICD-10-CM | POA: Diagnosis not present

## 2014-08-31 DIAGNOSIS — G309 Alzheimer's disease, unspecified: Secondary | ICD-10-CM | POA: Diagnosis not present

## 2014-08-31 DIAGNOSIS — K5900 Constipation, unspecified: Secondary | ICD-10-CM | POA: Diagnosis not present

## 2014-09-01 DIAGNOSIS — G309 Alzheimer's disease, unspecified: Secondary | ICD-10-CM | POA: Diagnosis not present

## 2014-09-02 DIAGNOSIS — G309 Alzheimer's disease, unspecified: Secondary | ICD-10-CM | POA: Diagnosis not present

## 2014-09-03 DIAGNOSIS — G309 Alzheimer's disease, unspecified: Secondary | ICD-10-CM | POA: Diagnosis not present

## 2014-09-04 DIAGNOSIS — G309 Alzheimer's disease, unspecified: Secondary | ICD-10-CM | POA: Diagnosis not present

## 2014-09-05 DIAGNOSIS — Z9181 History of falling: Secondary | ICD-10-CM | POA: Diagnosis not present

## 2014-09-05 DIAGNOSIS — R1310 Dysphagia, unspecified: Secondary | ICD-10-CM | POA: Diagnosis not present

## 2014-09-05 DIAGNOSIS — G309 Alzheimer's disease, unspecified: Secondary | ICD-10-CM | POA: Diagnosis not present

## 2014-09-06 DIAGNOSIS — G309 Alzheimer's disease, unspecified: Secondary | ICD-10-CM | POA: Diagnosis not present

## 2014-09-07 DIAGNOSIS — G309 Alzheimer's disease, unspecified: Secondary | ICD-10-CM | POA: Diagnosis not present

## 2014-09-08 DIAGNOSIS — G309 Alzheimer's disease, unspecified: Secondary | ICD-10-CM | POA: Diagnosis not present

## 2014-09-09 DIAGNOSIS — Z9181 History of falling: Secondary | ICD-10-CM | POA: Diagnosis not present

## 2014-09-09 DIAGNOSIS — R1310 Dysphagia, unspecified: Secondary | ICD-10-CM | POA: Diagnosis not present

## 2014-09-09 DIAGNOSIS — G309 Alzheimer's disease, unspecified: Secondary | ICD-10-CM | POA: Diagnosis not present

## 2014-09-10 DIAGNOSIS — G309 Alzheimer's disease, unspecified: Secondary | ICD-10-CM | POA: Diagnosis not present

## 2014-09-11 DIAGNOSIS — G309 Alzheimer's disease, unspecified: Secondary | ICD-10-CM | POA: Diagnosis not present

## 2014-09-12 DIAGNOSIS — G309 Alzheimer's disease, unspecified: Secondary | ICD-10-CM | POA: Diagnosis not present

## 2014-09-13 DIAGNOSIS — G309 Alzheimer's disease, unspecified: Secondary | ICD-10-CM | POA: Diagnosis not present

## 2014-09-13 DIAGNOSIS — Z9181 History of falling: Secondary | ICD-10-CM | POA: Diagnosis not present

## 2014-09-13 DIAGNOSIS — R1310 Dysphagia, unspecified: Secondary | ICD-10-CM | POA: Diagnosis not present

## 2014-09-14 DIAGNOSIS — G309 Alzheimer's disease, unspecified: Secondary | ICD-10-CM | POA: Diagnosis not present

## 2014-09-15 DIAGNOSIS — R1310 Dysphagia, unspecified: Secondary | ICD-10-CM | POA: Diagnosis not present

## 2014-09-15 DIAGNOSIS — Z9181 History of falling: Secondary | ICD-10-CM | POA: Diagnosis not present

## 2014-09-15 DIAGNOSIS — G309 Alzheimer's disease, unspecified: Secondary | ICD-10-CM | POA: Diagnosis not present

## 2014-09-16 DIAGNOSIS — G309 Alzheimer's disease, unspecified: Secondary | ICD-10-CM | POA: Diagnosis not present

## 2014-09-16 DIAGNOSIS — M79674 Pain in right toe(s): Secondary | ICD-10-CM | POA: Diagnosis not present

## 2014-09-16 DIAGNOSIS — R262 Difficulty in walking, not elsewhere classified: Secondary | ICD-10-CM | POA: Diagnosis not present

## 2014-09-16 DIAGNOSIS — B351 Tinea unguium: Secondary | ICD-10-CM | POA: Diagnosis not present

## 2014-09-16 DIAGNOSIS — M79675 Pain in left toe(s): Secondary | ICD-10-CM | POA: Diagnosis not present

## 2014-09-17 DIAGNOSIS — G309 Alzheimer's disease, unspecified: Secondary | ICD-10-CM | POA: Diagnosis not present

## 2014-09-18 DIAGNOSIS — G309 Alzheimer's disease, unspecified: Secondary | ICD-10-CM | POA: Diagnosis not present

## 2014-09-19 DIAGNOSIS — G309 Alzheimer's disease, unspecified: Secondary | ICD-10-CM | POA: Diagnosis not present

## 2014-09-20 DIAGNOSIS — R1310 Dysphagia, unspecified: Secondary | ICD-10-CM | POA: Diagnosis not present

## 2014-09-20 DIAGNOSIS — G309 Alzheimer's disease, unspecified: Secondary | ICD-10-CM | POA: Diagnosis not present

## 2014-09-20 DIAGNOSIS — Z9181 History of falling: Secondary | ICD-10-CM | POA: Diagnosis not present

## 2014-09-21 DIAGNOSIS — G309 Alzheimer's disease, unspecified: Secondary | ICD-10-CM | POA: Diagnosis not present

## 2014-09-22 DIAGNOSIS — G309 Alzheimer's disease, unspecified: Secondary | ICD-10-CM | POA: Diagnosis not present

## 2014-09-22 DIAGNOSIS — Z9181 History of falling: Secondary | ICD-10-CM | POA: Diagnosis not present

## 2014-09-22 DIAGNOSIS — R1310 Dysphagia, unspecified: Secondary | ICD-10-CM | POA: Diagnosis not present

## 2014-09-23 DIAGNOSIS — G309 Alzheimer's disease, unspecified: Secondary | ICD-10-CM | POA: Diagnosis not present

## 2014-09-24 DIAGNOSIS — G309 Alzheimer's disease, unspecified: Secondary | ICD-10-CM | POA: Diagnosis not present

## 2014-09-25 DIAGNOSIS — G309 Alzheimer's disease, unspecified: Secondary | ICD-10-CM | POA: Diagnosis not present

## 2014-09-26 DIAGNOSIS — G309 Alzheimer's disease, unspecified: Secondary | ICD-10-CM | POA: Diagnosis not present

## 2014-09-27 DIAGNOSIS — G309 Alzheimer's disease, unspecified: Secondary | ICD-10-CM | POA: Diagnosis not present

## 2014-09-28 DIAGNOSIS — G309 Alzheimer's disease, unspecified: Secondary | ICD-10-CM | POA: Diagnosis not present

## 2014-09-29 DIAGNOSIS — G309 Alzheimer's disease, unspecified: Secondary | ICD-10-CM | POA: Diagnosis not present

## 2014-09-30 DIAGNOSIS — G309 Alzheimer's disease, unspecified: Secondary | ICD-10-CM | POA: Diagnosis not present

## 2014-10-01 DIAGNOSIS — G309 Alzheimer's disease, unspecified: Secondary | ICD-10-CM | POA: Diagnosis not present

## 2014-10-02 DIAGNOSIS — G309 Alzheimer's disease, unspecified: Secondary | ICD-10-CM | POA: Diagnosis not present

## 2014-10-03 DIAGNOSIS — G309 Alzheimer's disease, unspecified: Secondary | ICD-10-CM | POA: Diagnosis not present

## 2014-10-04 DIAGNOSIS — G309 Alzheimer's disease, unspecified: Secondary | ICD-10-CM | POA: Diagnosis not present

## 2014-10-05 DIAGNOSIS — G309 Alzheimer's disease, unspecified: Secondary | ICD-10-CM | POA: Diagnosis not present

## 2014-10-06 DIAGNOSIS — G309 Alzheimer's disease, unspecified: Secondary | ICD-10-CM | POA: Diagnosis not present

## 2014-10-07 DIAGNOSIS — G309 Alzheimer's disease, unspecified: Secondary | ICD-10-CM | POA: Diagnosis not present

## 2014-10-08 DIAGNOSIS — G309 Alzheimer's disease, unspecified: Secondary | ICD-10-CM | POA: Diagnosis not present

## 2014-10-09 DIAGNOSIS — G309 Alzheimer's disease, unspecified: Secondary | ICD-10-CM | POA: Diagnosis not present

## 2014-10-10 DIAGNOSIS — G309 Alzheimer's disease, unspecified: Secondary | ICD-10-CM | POA: Diagnosis not present

## 2014-10-11 DIAGNOSIS — G309 Alzheimer's disease, unspecified: Secondary | ICD-10-CM | POA: Diagnosis not present

## 2014-10-12 DIAGNOSIS — G309 Alzheimer's disease, unspecified: Secondary | ICD-10-CM | POA: Diagnosis not present

## 2014-10-13 DIAGNOSIS — G309 Alzheimer's disease, unspecified: Secondary | ICD-10-CM | POA: Diagnosis not present

## 2014-10-14 DIAGNOSIS — G309 Alzheimer's disease, unspecified: Secondary | ICD-10-CM | POA: Diagnosis not present

## 2014-10-15 DIAGNOSIS — G309 Alzheimer's disease, unspecified: Secondary | ICD-10-CM | POA: Diagnosis not present

## 2014-10-16 DIAGNOSIS — G309 Alzheimer's disease, unspecified: Secondary | ICD-10-CM | POA: Diagnosis not present

## 2014-10-17 DIAGNOSIS — G309 Alzheimer's disease, unspecified: Secondary | ICD-10-CM | POA: Diagnosis not present

## 2014-10-18 DIAGNOSIS — G309 Alzheimer's disease, unspecified: Secondary | ICD-10-CM | POA: Diagnosis not present

## 2014-10-19 DIAGNOSIS — G309 Alzheimer's disease, unspecified: Secondary | ICD-10-CM | POA: Diagnosis not present

## 2014-10-20 DIAGNOSIS — F0390 Unspecified dementia without behavioral disturbance: Secondary | ICD-10-CM | POA: Diagnosis not present

## 2014-10-20 DIAGNOSIS — R5383 Other fatigue: Secondary | ICD-10-CM | POA: Diagnosis not present

## 2014-10-20 DIAGNOSIS — K5900 Constipation, unspecified: Secondary | ICD-10-CM | POA: Diagnosis not present

## 2014-10-20 DIAGNOSIS — K210 Gastro-esophageal reflux disease with esophagitis: Secondary | ICD-10-CM | POA: Diagnosis not present

## 2014-10-20 DIAGNOSIS — G309 Alzheimer's disease, unspecified: Secondary | ICD-10-CM | POA: Diagnosis not present

## 2014-10-21 DIAGNOSIS — G309 Alzheimer's disease, unspecified: Secondary | ICD-10-CM | POA: Diagnosis not present

## 2014-10-22 DIAGNOSIS — G309 Alzheimer's disease, unspecified: Secondary | ICD-10-CM | POA: Diagnosis not present

## 2014-10-23 DIAGNOSIS — G309 Alzheimer's disease, unspecified: Secondary | ICD-10-CM | POA: Diagnosis not present

## 2014-10-24 DIAGNOSIS — G309 Alzheimer's disease, unspecified: Secondary | ICD-10-CM | POA: Diagnosis not present

## 2014-10-25 DIAGNOSIS — G309 Alzheimer's disease, unspecified: Secondary | ICD-10-CM | POA: Diagnosis not present

## 2014-10-26 DIAGNOSIS — G309 Alzheimer's disease, unspecified: Secondary | ICD-10-CM | POA: Diagnosis not present

## 2014-10-27 DIAGNOSIS — G309 Alzheimer's disease, unspecified: Secondary | ICD-10-CM | POA: Diagnosis not present

## 2014-10-28 DIAGNOSIS — G309 Alzheimer's disease, unspecified: Secondary | ICD-10-CM | POA: Diagnosis not present

## 2014-10-29 DIAGNOSIS — G309 Alzheimer's disease, unspecified: Secondary | ICD-10-CM | POA: Diagnosis not present

## 2014-10-30 DIAGNOSIS — G309 Alzheimer's disease, unspecified: Secondary | ICD-10-CM | POA: Diagnosis not present

## 2014-10-31 DIAGNOSIS — G309 Alzheimer's disease, unspecified: Secondary | ICD-10-CM | POA: Diagnosis not present

## 2014-11-01 DIAGNOSIS — G309 Alzheimer's disease, unspecified: Secondary | ICD-10-CM | POA: Diagnosis not present

## 2014-11-02 DIAGNOSIS — G309 Alzheimer's disease, unspecified: Secondary | ICD-10-CM | POA: Diagnosis not present

## 2014-11-03 DIAGNOSIS — G309 Alzheimer's disease, unspecified: Secondary | ICD-10-CM | POA: Diagnosis not present

## 2014-11-04 DIAGNOSIS — G309 Alzheimer's disease, unspecified: Secondary | ICD-10-CM | POA: Diagnosis not present

## 2014-11-05 DIAGNOSIS — G309 Alzheimer's disease, unspecified: Secondary | ICD-10-CM | POA: Diagnosis not present

## 2014-11-06 DIAGNOSIS — G309 Alzheimer's disease, unspecified: Secondary | ICD-10-CM | POA: Diagnosis not present

## 2014-11-07 DIAGNOSIS — G309 Alzheimer's disease, unspecified: Secondary | ICD-10-CM | POA: Diagnosis not present

## 2014-11-08 DIAGNOSIS — G309 Alzheimer's disease, unspecified: Secondary | ICD-10-CM | POA: Diagnosis not present

## 2014-11-09 DIAGNOSIS — G309 Alzheimer's disease, unspecified: Secondary | ICD-10-CM | POA: Diagnosis not present

## 2014-11-10 DIAGNOSIS — G309 Alzheimer's disease, unspecified: Secondary | ICD-10-CM | POA: Diagnosis not present

## 2014-11-11 DIAGNOSIS — G309 Alzheimer's disease, unspecified: Secondary | ICD-10-CM | POA: Diagnosis not present

## 2014-11-12 DIAGNOSIS — G309 Alzheimer's disease, unspecified: Secondary | ICD-10-CM | POA: Diagnosis not present

## 2014-11-13 DIAGNOSIS — G309 Alzheimer's disease, unspecified: Secondary | ICD-10-CM | POA: Diagnosis not present

## 2014-11-14 DIAGNOSIS — G309 Alzheimer's disease, unspecified: Secondary | ICD-10-CM | POA: Diagnosis not present

## 2014-11-15 DIAGNOSIS — G309 Alzheimer's disease, unspecified: Secondary | ICD-10-CM | POA: Diagnosis not present

## 2014-11-16 DIAGNOSIS — G309 Alzheimer's disease, unspecified: Secondary | ICD-10-CM | POA: Diagnosis not present

## 2014-11-17 DIAGNOSIS — G309 Alzheimer's disease, unspecified: Secondary | ICD-10-CM | POA: Diagnosis not present

## 2014-11-18 DIAGNOSIS — G309 Alzheimer's disease, unspecified: Secondary | ICD-10-CM | POA: Diagnosis not present

## 2014-11-19 DIAGNOSIS — G309 Alzheimer's disease, unspecified: Secondary | ICD-10-CM | POA: Diagnosis not present

## 2014-11-20 DIAGNOSIS — G309 Alzheimer's disease, unspecified: Secondary | ICD-10-CM | POA: Diagnosis not present

## 2014-11-21 DIAGNOSIS — G309 Alzheimer's disease, unspecified: Secondary | ICD-10-CM | POA: Diagnosis not present

## 2014-11-22 DIAGNOSIS — G309 Alzheimer's disease, unspecified: Secondary | ICD-10-CM | POA: Diagnosis not present

## 2014-11-23 DIAGNOSIS — G309 Alzheimer's disease, unspecified: Secondary | ICD-10-CM | POA: Diagnosis not present

## 2014-11-24 DIAGNOSIS — G309 Alzheimer's disease, unspecified: Secondary | ICD-10-CM | POA: Diagnosis not present

## 2014-11-25 DIAGNOSIS — G309 Alzheimer's disease, unspecified: Secondary | ICD-10-CM | POA: Diagnosis not present

## 2014-11-26 DIAGNOSIS — G309 Alzheimer's disease, unspecified: Secondary | ICD-10-CM | POA: Diagnosis not present

## 2014-11-27 DIAGNOSIS — G309 Alzheimer's disease, unspecified: Secondary | ICD-10-CM | POA: Diagnosis not present

## 2014-11-28 DIAGNOSIS — G309 Alzheimer's disease, unspecified: Secondary | ICD-10-CM | POA: Diagnosis not present

## 2014-11-29 DIAGNOSIS — G309 Alzheimer's disease, unspecified: Secondary | ICD-10-CM | POA: Diagnosis not present

## 2014-11-30 DIAGNOSIS — G309 Alzheimer's disease, unspecified: Secondary | ICD-10-CM | POA: Diagnosis not present

## 2014-12-01 DIAGNOSIS — G309 Alzheimer's disease, unspecified: Secondary | ICD-10-CM | POA: Diagnosis not present

## 2014-12-02 DIAGNOSIS — G309 Alzheimer's disease, unspecified: Secondary | ICD-10-CM | POA: Diagnosis not present

## 2014-12-03 DIAGNOSIS — G309 Alzheimer's disease, unspecified: Secondary | ICD-10-CM | POA: Diagnosis not present

## 2014-12-04 DIAGNOSIS — G309 Alzheimer's disease, unspecified: Secondary | ICD-10-CM | POA: Diagnosis not present

## 2014-12-05 DIAGNOSIS — G309 Alzheimer's disease, unspecified: Secondary | ICD-10-CM | POA: Diagnosis not present

## 2014-12-06 DIAGNOSIS — G309 Alzheimer's disease, unspecified: Secondary | ICD-10-CM | POA: Diagnosis not present

## 2014-12-07 DIAGNOSIS — G309 Alzheimer's disease, unspecified: Secondary | ICD-10-CM | POA: Diagnosis not present

## 2014-12-08 DIAGNOSIS — G309 Alzheimer's disease, unspecified: Secondary | ICD-10-CM | POA: Diagnosis not present

## 2014-12-09 DIAGNOSIS — G309 Alzheimer's disease, unspecified: Secondary | ICD-10-CM | POA: Diagnosis not present

## 2014-12-10 DIAGNOSIS — G309 Alzheimer's disease, unspecified: Secondary | ICD-10-CM | POA: Diagnosis not present

## 2014-12-11 DIAGNOSIS — G309 Alzheimer's disease, unspecified: Secondary | ICD-10-CM | POA: Diagnosis not present

## 2014-12-12 DIAGNOSIS — G309 Alzheimer's disease, unspecified: Secondary | ICD-10-CM | POA: Diagnosis not present

## 2014-12-13 DIAGNOSIS — G309 Alzheimer's disease, unspecified: Secondary | ICD-10-CM | POA: Diagnosis not present

## 2014-12-14 DIAGNOSIS — G309 Alzheimer's disease, unspecified: Secondary | ICD-10-CM | POA: Diagnosis not present

## 2014-12-15 DIAGNOSIS — G309 Alzheimer's disease, unspecified: Secondary | ICD-10-CM | POA: Diagnosis not present

## 2014-12-16 DIAGNOSIS — G309 Alzheimer's disease, unspecified: Secondary | ICD-10-CM | POA: Diagnosis not present

## 2014-12-17 DIAGNOSIS — G309 Alzheimer's disease, unspecified: Secondary | ICD-10-CM | POA: Diagnosis not present

## 2014-12-18 DIAGNOSIS — G309 Alzheimer's disease, unspecified: Secondary | ICD-10-CM | POA: Diagnosis not present

## 2014-12-19 DIAGNOSIS — G309 Alzheimer's disease, unspecified: Secondary | ICD-10-CM | POA: Diagnosis not present

## 2014-12-20 DIAGNOSIS — G309 Alzheimer's disease, unspecified: Secondary | ICD-10-CM | POA: Diagnosis not present

## 2014-12-21 DIAGNOSIS — G309 Alzheimer's disease, unspecified: Secondary | ICD-10-CM | POA: Diagnosis not present

## 2014-12-22 DIAGNOSIS — G309 Alzheimer's disease, unspecified: Secondary | ICD-10-CM | POA: Diagnosis not present

## 2014-12-23 DIAGNOSIS — G309 Alzheimer's disease, unspecified: Secondary | ICD-10-CM | POA: Diagnosis not present

## 2014-12-24 DIAGNOSIS — G309 Alzheimer's disease, unspecified: Secondary | ICD-10-CM | POA: Diagnosis not present

## 2014-12-25 DIAGNOSIS — G309 Alzheimer's disease, unspecified: Secondary | ICD-10-CM | POA: Diagnosis not present

## 2014-12-26 DIAGNOSIS — G309 Alzheimer's disease, unspecified: Secondary | ICD-10-CM | POA: Diagnosis not present

## 2014-12-27 DIAGNOSIS — G309 Alzheimer's disease, unspecified: Secondary | ICD-10-CM | POA: Diagnosis not present

## 2014-12-28 DIAGNOSIS — G309 Alzheimer's disease, unspecified: Secondary | ICD-10-CM | POA: Diagnosis not present

## 2014-12-29 DIAGNOSIS — K210 Gastro-esophageal reflux disease with esophagitis: Secondary | ICD-10-CM | POA: Diagnosis not present

## 2014-12-29 DIAGNOSIS — F0390 Unspecified dementia without behavioral disturbance: Secondary | ICD-10-CM | POA: Diagnosis not present

## 2014-12-29 DIAGNOSIS — G309 Alzheimer's disease, unspecified: Secondary | ICD-10-CM | POA: Diagnosis not present

## 2014-12-30 DIAGNOSIS — G309 Alzheimer's disease, unspecified: Secondary | ICD-10-CM | POA: Diagnosis not present

## 2014-12-30 DIAGNOSIS — M79674 Pain in right toe(s): Secondary | ICD-10-CM | POA: Diagnosis not present

## 2014-12-30 DIAGNOSIS — R262 Difficulty in walking, not elsewhere classified: Secondary | ICD-10-CM | POA: Diagnosis not present

## 2014-12-30 DIAGNOSIS — B351 Tinea unguium: Secondary | ICD-10-CM | POA: Diagnosis not present

## 2014-12-30 DIAGNOSIS — M79675 Pain in left toe(s): Secondary | ICD-10-CM | POA: Diagnosis not present

## 2014-12-31 DIAGNOSIS — G309 Alzheimer's disease, unspecified: Secondary | ICD-10-CM | POA: Diagnosis not present

## 2015-01-01 DIAGNOSIS — G309 Alzheimer's disease, unspecified: Secondary | ICD-10-CM | POA: Diagnosis not present

## 2015-01-02 DIAGNOSIS — G309 Alzheimer's disease, unspecified: Secondary | ICD-10-CM | POA: Diagnosis not present

## 2015-01-03 DIAGNOSIS — G309 Alzheimer's disease, unspecified: Secondary | ICD-10-CM | POA: Diagnosis not present

## 2015-01-04 DIAGNOSIS — G309 Alzheimer's disease, unspecified: Secondary | ICD-10-CM | POA: Diagnosis not present

## 2015-01-05 DIAGNOSIS — G309 Alzheimer's disease, unspecified: Secondary | ICD-10-CM | POA: Diagnosis not present

## 2015-01-06 DIAGNOSIS — G309 Alzheimer's disease, unspecified: Secondary | ICD-10-CM | POA: Diagnosis not present

## 2015-01-07 DIAGNOSIS — G309 Alzheimer's disease, unspecified: Secondary | ICD-10-CM | POA: Diagnosis not present

## 2015-01-08 DIAGNOSIS — G309 Alzheimer's disease, unspecified: Secondary | ICD-10-CM | POA: Diagnosis not present

## 2015-01-09 DIAGNOSIS — G309 Alzheimer's disease, unspecified: Secondary | ICD-10-CM | POA: Diagnosis not present

## 2015-01-10 DIAGNOSIS — G309 Alzheimer's disease, unspecified: Secondary | ICD-10-CM | POA: Diagnosis not present

## 2015-01-11 DIAGNOSIS — G309 Alzheimer's disease, unspecified: Secondary | ICD-10-CM | POA: Diagnosis not present

## 2015-01-12 DIAGNOSIS — G309 Alzheimer's disease, unspecified: Secondary | ICD-10-CM | POA: Diagnosis not present

## 2015-01-13 DIAGNOSIS — G309 Alzheimer's disease, unspecified: Secondary | ICD-10-CM | POA: Diagnosis not present

## 2015-01-14 DIAGNOSIS — G309 Alzheimer's disease, unspecified: Secondary | ICD-10-CM | POA: Diagnosis not present

## 2015-01-15 DIAGNOSIS — G309 Alzheimer's disease, unspecified: Secondary | ICD-10-CM | POA: Diagnosis not present

## 2015-01-16 DIAGNOSIS — G309 Alzheimer's disease, unspecified: Secondary | ICD-10-CM | POA: Diagnosis not present

## 2015-01-17 DIAGNOSIS — G309 Alzheimer's disease, unspecified: Secondary | ICD-10-CM | POA: Diagnosis not present

## 2015-01-18 DIAGNOSIS — G309 Alzheimer's disease, unspecified: Secondary | ICD-10-CM | POA: Diagnosis not present

## 2015-01-19 DIAGNOSIS — G309 Alzheimer's disease, unspecified: Secondary | ICD-10-CM | POA: Diagnosis not present

## 2015-01-20 DIAGNOSIS — E038 Other specified hypothyroidism: Secondary | ICD-10-CM | POA: Diagnosis not present

## 2015-01-20 DIAGNOSIS — G309 Alzheimer's disease, unspecified: Secondary | ICD-10-CM | POA: Diagnosis not present

## 2015-01-20 DIAGNOSIS — E784 Other hyperlipidemia: Secondary | ICD-10-CM | POA: Diagnosis not present

## 2015-01-20 DIAGNOSIS — E1165 Type 2 diabetes mellitus with hyperglycemia: Secondary | ICD-10-CM | POA: Diagnosis not present

## 2015-01-20 DIAGNOSIS — D518 Other vitamin B12 deficiency anemias: Secondary | ICD-10-CM | POA: Diagnosis not present

## 2015-01-21 DIAGNOSIS — G309 Alzheimer's disease, unspecified: Secondary | ICD-10-CM | POA: Diagnosis not present

## 2015-01-22 DIAGNOSIS — G309 Alzheimer's disease, unspecified: Secondary | ICD-10-CM | POA: Diagnosis not present

## 2015-01-23 DIAGNOSIS — I10 Essential (primary) hypertension: Secondary | ICD-10-CM | POA: Diagnosis not present

## 2015-01-23 DIAGNOSIS — D649 Anemia, unspecified: Secondary | ICD-10-CM | POA: Diagnosis not present

## 2015-01-23 DIAGNOSIS — E119 Type 2 diabetes mellitus without complications: Secondary | ICD-10-CM | POA: Diagnosis not present

## 2015-01-23 DIAGNOSIS — G309 Alzheimer's disease, unspecified: Secondary | ICD-10-CM | POA: Diagnosis not present

## 2015-01-23 DIAGNOSIS — Z79899 Other long term (current) drug therapy: Secondary | ICD-10-CM | POA: Diagnosis not present

## 2015-01-24 DIAGNOSIS — G309 Alzheimer's disease, unspecified: Secondary | ICD-10-CM | POA: Diagnosis not present

## 2015-01-25 DIAGNOSIS — G309 Alzheimer's disease, unspecified: Secondary | ICD-10-CM | POA: Diagnosis not present

## 2015-01-26 DIAGNOSIS — G309 Alzheimer's disease, unspecified: Secondary | ICD-10-CM | POA: Diagnosis not present

## 2015-01-27 DIAGNOSIS — G309 Alzheimer's disease, unspecified: Secondary | ICD-10-CM | POA: Diagnosis not present

## 2015-01-28 DIAGNOSIS — G309 Alzheimer's disease, unspecified: Secondary | ICD-10-CM | POA: Diagnosis not present

## 2015-01-29 DIAGNOSIS — G309 Alzheimer's disease, unspecified: Secondary | ICD-10-CM | POA: Diagnosis not present

## 2015-01-30 DIAGNOSIS — G309 Alzheimer's disease, unspecified: Secondary | ICD-10-CM | POA: Diagnosis not present

## 2015-01-31 DIAGNOSIS — G309 Alzheimer's disease, unspecified: Secondary | ICD-10-CM | POA: Diagnosis not present

## 2015-02-01 DIAGNOSIS — G309 Alzheimer's disease, unspecified: Secondary | ICD-10-CM | POA: Diagnosis not present

## 2015-02-02 DIAGNOSIS — G309 Alzheimer's disease, unspecified: Secondary | ICD-10-CM | POA: Diagnosis not present

## 2015-02-03 DIAGNOSIS — G309 Alzheimer's disease, unspecified: Secondary | ICD-10-CM | POA: Diagnosis not present

## 2015-02-04 DIAGNOSIS — G309 Alzheimer's disease, unspecified: Secondary | ICD-10-CM | POA: Diagnosis not present

## 2015-02-05 DIAGNOSIS — G309 Alzheimer's disease, unspecified: Secondary | ICD-10-CM | POA: Diagnosis not present

## 2015-02-06 DIAGNOSIS — G309 Alzheimer's disease, unspecified: Secondary | ICD-10-CM | POA: Diagnosis not present

## 2015-02-07 DIAGNOSIS — G309 Alzheimer's disease, unspecified: Secondary | ICD-10-CM | POA: Diagnosis not present

## 2015-02-08 DIAGNOSIS — G309 Alzheimer's disease, unspecified: Secondary | ICD-10-CM | POA: Diagnosis not present

## 2015-02-09 DIAGNOSIS — G309 Alzheimer's disease, unspecified: Secondary | ICD-10-CM | POA: Diagnosis not present

## 2015-02-10 DIAGNOSIS — G309 Alzheimer's disease, unspecified: Secondary | ICD-10-CM | POA: Diagnosis not present

## 2015-02-11 DIAGNOSIS — G309 Alzheimer's disease, unspecified: Secondary | ICD-10-CM | POA: Diagnosis not present

## 2015-02-12 DIAGNOSIS — G309 Alzheimer's disease, unspecified: Secondary | ICD-10-CM | POA: Diagnosis not present

## 2015-02-13 DIAGNOSIS — G309 Alzheimer's disease, unspecified: Secondary | ICD-10-CM | POA: Diagnosis not present

## 2015-02-14 DIAGNOSIS — G309 Alzheimer's disease, unspecified: Secondary | ICD-10-CM | POA: Diagnosis not present

## 2015-02-15 DIAGNOSIS — G309 Alzheimer's disease, unspecified: Secondary | ICD-10-CM | POA: Diagnosis not present

## 2015-02-16 DIAGNOSIS — G309 Alzheimer's disease, unspecified: Secondary | ICD-10-CM | POA: Diagnosis not present

## 2015-02-17 DIAGNOSIS — G309 Alzheimer's disease, unspecified: Secondary | ICD-10-CM | POA: Diagnosis not present

## 2015-02-18 DIAGNOSIS — G309 Alzheimer's disease, unspecified: Secondary | ICD-10-CM | POA: Diagnosis not present

## 2015-02-19 DIAGNOSIS — G309 Alzheimer's disease, unspecified: Secondary | ICD-10-CM | POA: Diagnosis not present

## 2015-02-20 DIAGNOSIS — G309 Alzheimer's disease, unspecified: Secondary | ICD-10-CM | POA: Diagnosis not present

## 2015-02-21 DIAGNOSIS — G309 Alzheimer's disease, unspecified: Secondary | ICD-10-CM | POA: Diagnosis not present

## 2015-02-22 DIAGNOSIS — G309 Alzheimer's disease, unspecified: Secondary | ICD-10-CM | POA: Diagnosis not present

## 2015-02-23 DIAGNOSIS — G309 Alzheimer's disease, unspecified: Secondary | ICD-10-CM | POA: Diagnosis not present

## 2015-02-24 DIAGNOSIS — G309 Alzheimer's disease, unspecified: Secondary | ICD-10-CM | POA: Diagnosis not present

## 2015-02-25 DIAGNOSIS — G309 Alzheimer's disease, unspecified: Secondary | ICD-10-CM | POA: Diagnosis not present

## 2015-02-26 DIAGNOSIS — G309 Alzheimer's disease, unspecified: Secondary | ICD-10-CM | POA: Diagnosis not present

## 2015-02-27 DIAGNOSIS — G309 Alzheimer's disease, unspecified: Secondary | ICD-10-CM | POA: Diagnosis not present

## 2015-02-28 DIAGNOSIS — G309 Alzheimer's disease, unspecified: Secondary | ICD-10-CM | POA: Diagnosis not present

## 2015-03-01 DIAGNOSIS — G309 Alzheimer's disease, unspecified: Secondary | ICD-10-CM | POA: Diagnosis not present

## 2015-03-02 DIAGNOSIS — G309 Alzheimer's disease, unspecified: Secondary | ICD-10-CM | POA: Diagnosis not present

## 2015-03-03 DIAGNOSIS — G309 Alzheimer's disease, unspecified: Secondary | ICD-10-CM | POA: Diagnosis not present

## 2015-03-04 DIAGNOSIS — G309 Alzheimer's disease, unspecified: Secondary | ICD-10-CM | POA: Diagnosis not present

## 2015-03-05 DIAGNOSIS — G309 Alzheimer's disease, unspecified: Secondary | ICD-10-CM | POA: Diagnosis not present

## 2015-03-06 DIAGNOSIS — G309 Alzheimer's disease, unspecified: Secondary | ICD-10-CM | POA: Diagnosis not present

## 2015-03-07 DIAGNOSIS — G309 Alzheimer's disease, unspecified: Secondary | ICD-10-CM | POA: Diagnosis not present

## 2015-03-08 DIAGNOSIS — G309 Alzheimer's disease, unspecified: Secondary | ICD-10-CM | POA: Diagnosis not present

## 2015-03-09 DIAGNOSIS — G309 Alzheimer's disease, unspecified: Secondary | ICD-10-CM | POA: Diagnosis not present

## 2015-03-10 DIAGNOSIS — G309 Alzheimer's disease, unspecified: Secondary | ICD-10-CM | POA: Diagnosis not present

## 2015-03-11 DIAGNOSIS — G309 Alzheimer's disease, unspecified: Secondary | ICD-10-CM | POA: Diagnosis not present

## 2015-03-12 DIAGNOSIS — G309 Alzheimer's disease, unspecified: Secondary | ICD-10-CM | POA: Diagnosis not present

## 2015-03-13 DIAGNOSIS — G309 Alzheimer's disease, unspecified: Secondary | ICD-10-CM | POA: Diagnosis not present

## 2015-03-14 DIAGNOSIS — G309 Alzheimer's disease, unspecified: Secondary | ICD-10-CM | POA: Diagnosis not present

## 2015-03-15 DIAGNOSIS — G309 Alzheimer's disease, unspecified: Secondary | ICD-10-CM | POA: Diagnosis not present

## 2015-03-16 DIAGNOSIS — F0390 Unspecified dementia without behavioral disturbance: Secondary | ICD-10-CM | POA: Diagnosis not present

## 2015-03-16 DIAGNOSIS — K219 Gastro-esophageal reflux disease without esophagitis: Secondary | ICD-10-CM | POA: Diagnosis not present

## 2015-03-16 DIAGNOSIS — G309 Alzheimer's disease, unspecified: Secondary | ICD-10-CM | POA: Diagnosis not present

## 2015-03-17 DIAGNOSIS — G309 Alzheimer's disease, unspecified: Secondary | ICD-10-CM | POA: Diagnosis not present

## 2015-03-18 DIAGNOSIS — G309 Alzheimer's disease, unspecified: Secondary | ICD-10-CM | POA: Diagnosis not present

## 2015-03-19 DIAGNOSIS — G309 Alzheimer's disease, unspecified: Secondary | ICD-10-CM | POA: Diagnosis not present

## 2015-03-20 DIAGNOSIS — G309 Alzheimer's disease, unspecified: Secondary | ICD-10-CM | POA: Diagnosis not present

## 2015-03-21 DIAGNOSIS — G309 Alzheimer's disease, unspecified: Secondary | ICD-10-CM | POA: Diagnosis not present

## 2015-03-22 DIAGNOSIS — G309 Alzheimer's disease, unspecified: Secondary | ICD-10-CM | POA: Diagnosis not present

## 2015-03-23 DIAGNOSIS — G309 Alzheimer's disease, unspecified: Secondary | ICD-10-CM | POA: Diagnosis not present

## 2015-03-24 DIAGNOSIS — G309 Alzheimer's disease, unspecified: Secondary | ICD-10-CM | POA: Diagnosis not present

## 2015-03-25 DIAGNOSIS — G309 Alzheimer's disease, unspecified: Secondary | ICD-10-CM | POA: Diagnosis not present

## 2015-03-26 DIAGNOSIS — G309 Alzheimer's disease, unspecified: Secondary | ICD-10-CM | POA: Diagnosis not present

## 2015-03-27 DIAGNOSIS — G309 Alzheimer's disease, unspecified: Secondary | ICD-10-CM | POA: Diagnosis not present

## 2015-03-28 DIAGNOSIS — G309 Alzheimer's disease, unspecified: Secondary | ICD-10-CM | POA: Diagnosis not present

## 2015-03-29 DIAGNOSIS — G309 Alzheimer's disease, unspecified: Secondary | ICD-10-CM | POA: Diagnosis not present

## 2015-03-30 DIAGNOSIS — G309 Alzheimer's disease, unspecified: Secondary | ICD-10-CM | POA: Diagnosis not present

## 2015-03-30 DIAGNOSIS — N184 Chronic kidney disease, stage 4 (severe): Secondary | ICD-10-CM | POA: Diagnosis not present

## 2015-03-30 DIAGNOSIS — R32 Unspecified urinary incontinence: Secondary | ICD-10-CM | POA: Diagnosis not present

## 2015-03-31 DIAGNOSIS — G309 Alzheimer's disease, unspecified: Secondary | ICD-10-CM | POA: Diagnosis not present

## 2015-04-01 DIAGNOSIS — G309 Alzheimer's disease, unspecified: Secondary | ICD-10-CM | POA: Diagnosis not present

## 2015-04-02 DIAGNOSIS — G309 Alzheimer's disease, unspecified: Secondary | ICD-10-CM | POA: Diagnosis not present

## 2015-04-03 DIAGNOSIS — G309 Alzheimer's disease, unspecified: Secondary | ICD-10-CM | POA: Diagnosis not present

## 2015-04-04 DIAGNOSIS — G309 Alzheimer's disease, unspecified: Secondary | ICD-10-CM | POA: Diagnosis not present

## 2015-04-05 DIAGNOSIS — G309 Alzheimer's disease, unspecified: Secondary | ICD-10-CM | POA: Diagnosis not present

## 2015-04-06 DIAGNOSIS — G309 Alzheimer's disease, unspecified: Secondary | ICD-10-CM | POA: Diagnosis not present

## 2015-04-07 DIAGNOSIS — G309 Alzheimer's disease, unspecified: Secondary | ICD-10-CM | POA: Diagnosis not present

## 2015-04-08 DIAGNOSIS — G309 Alzheimer's disease, unspecified: Secondary | ICD-10-CM | POA: Diagnosis not present

## 2015-04-09 DIAGNOSIS — G309 Alzheimer's disease, unspecified: Secondary | ICD-10-CM | POA: Diagnosis not present

## 2015-04-10 DIAGNOSIS — G309 Alzheimer's disease, unspecified: Secondary | ICD-10-CM | POA: Diagnosis not present

## 2015-04-11 DIAGNOSIS — G309 Alzheimer's disease, unspecified: Secondary | ICD-10-CM | POA: Diagnosis not present

## 2015-04-12 DIAGNOSIS — G309 Alzheimer's disease, unspecified: Secondary | ICD-10-CM | POA: Diagnosis not present

## 2015-04-13 DIAGNOSIS — G309 Alzheimer's disease, unspecified: Secondary | ICD-10-CM | POA: Diagnosis not present

## 2015-04-14 DIAGNOSIS — G309 Alzheimer's disease, unspecified: Secondary | ICD-10-CM | POA: Diagnosis not present

## 2015-04-15 DIAGNOSIS — G309 Alzheimer's disease, unspecified: Secondary | ICD-10-CM | POA: Diagnosis not present

## 2015-04-16 DIAGNOSIS — G309 Alzheimer's disease, unspecified: Secondary | ICD-10-CM | POA: Diagnosis not present

## 2015-04-17 DIAGNOSIS — G309 Alzheimer's disease, unspecified: Secondary | ICD-10-CM | POA: Diagnosis not present

## 2015-04-18 DIAGNOSIS — G309 Alzheimer's disease, unspecified: Secondary | ICD-10-CM | POA: Diagnosis not present

## 2015-04-19 DIAGNOSIS — G309 Alzheimer's disease, unspecified: Secondary | ICD-10-CM | POA: Diagnosis not present

## 2015-04-20 DIAGNOSIS — G309 Alzheimer's disease, unspecified: Secondary | ICD-10-CM | POA: Diagnosis not present

## 2015-04-21 DIAGNOSIS — G309 Alzheimer's disease, unspecified: Secondary | ICD-10-CM | POA: Diagnosis not present

## 2015-04-22 DIAGNOSIS — G309 Alzheimer's disease, unspecified: Secondary | ICD-10-CM | POA: Diagnosis not present

## 2015-04-23 DIAGNOSIS — G309 Alzheimer's disease, unspecified: Secondary | ICD-10-CM | POA: Diagnosis not present

## 2015-04-24 DIAGNOSIS — G309 Alzheimer's disease, unspecified: Secondary | ICD-10-CM | POA: Diagnosis not present

## 2015-04-25 DIAGNOSIS — G309 Alzheimer's disease, unspecified: Secondary | ICD-10-CM | POA: Diagnosis not present

## 2015-04-26 DIAGNOSIS — G309 Alzheimer's disease, unspecified: Secondary | ICD-10-CM | POA: Diagnosis not present

## 2015-04-27 DIAGNOSIS — G309 Alzheimer's disease, unspecified: Secondary | ICD-10-CM | POA: Diagnosis not present

## 2015-04-28 DIAGNOSIS — G309 Alzheimer's disease, unspecified: Secondary | ICD-10-CM | POA: Diagnosis not present

## 2015-04-29 DIAGNOSIS — G309 Alzheimer's disease, unspecified: Secondary | ICD-10-CM | POA: Diagnosis not present

## 2015-04-30 DIAGNOSIS — G309 Alzheimer's disease, unspecified: Secondary | ICD-10-CM | POA: Diagnosis not present

## 2015-05-01 DIAGNOSIS — G309 Alzheimer's disease, unspecified: Secondary | ICD-10-CM | POA: Diagnosis not present

## 2015-05-02 DIAGNOSIS — G309 Alzheimer's disease, unspecified: Secondary | ICD-10-CM | POA: Diagnosis not present

## 2015-05-03 DIAGNOSIS — G309 Alzheimer's disease, unspecified: Secondary | ICD-10-CM | POA: Diagnosis not present

## 2015-05-04 DIAGNOSIS — G309 Alzheimer's disease, unspecified: Secondary | ICD-10-CM | POA: Diagnosis not present

## 2015-05-05 DIAGNOSIS — G309 Alzheimer's disease, unspecified: Secondary | ICD-10-CM | POA: Diagnosis not present

## 2015-05-06 DIAGNOSIS — G309 Alzheimer's disease, unspecified: Secondary | ICD-10-CM | POA: Diagnosis not present

## 2015-05-07 DIAGNOSIS — G309 Alzheimer's disease, unspecified: Secondary | ICD-10-CM | POA: Diagnosis not present

## 2015-05-08 DIAGNOSIS — G309 Alzheimer's disease, unspecified: Secondary | ICD-10-CM | POA: Diagnosis not present

## 2015-05-09 DIAGNOSIS — G309 Alzheimer's disease, unspecified: Secondary | ICD-10-CM | POA: Diagnosis not present

## 2015-05-10 DIAGNOSIS — G309 Alzheimer's disease, unspecified: Secondary | ICD-10-CM | POA: Diagnosis not present

## 2015-05-11 DIAGNOSIS — G309 Alzheimer's disease, unspecified: Secondary | ICD-10-CM | POA: Diagnosis not present

## 2015-05-12 DIAGNOSIS — G309 Alzheimer's disease, unspecified: Secondary | ICD-10-CM | POA: Diagnosis not present

## 2015-05-13 DIAGNOSIS — G309 Alzheimer's disease, unspecified: Secondary | ICD-10-CM | POA: Diagnosis not present

## 2015-05-14 DIAGNOSIS — G309 Alzheimer's disease, unspecified: Secondary | ICD-10-CM | POA: Diagnosis not present

## 2015-05-15 DIAGNOSIS — G309 Alzheimer's disease, unspecified: Secondary | ICD-10-CM | POA: Diagnosis not present

## 2015-05-16 DIAGNOSIS — G309 Alzheimer's disease, unspecified: Secondary | ICD-10-CM | POA: Diagnosis not present

## 2015-05-17 DIAGNOSIS — G309 Alzheimer's disease, unspecified: Secondary | ICD-10-CM | POA: Diagnosis not present

## 2015-05-18 DIAGNOSIS — G309 Alzheimer's disease, unspecified: Secondary | ICD-10-CM | POA: Diagnosis not present

## 2015-05-19 DIAGNOSIS — G309 Alzheimer's disease, unspecified: Secondary | ICD-10-CM | POA: Diagnosis not present

## 2015-05-20 DIAGNOSIS — G309 Alzheimer's disease, unspecified: Secondary | ICD-10-CM | POA: Diagnosis not present

## 2015-05-21 DIAGNOSIS — G309 Alzheimer's disease, unspecified: Secondary | ICD-10-CM | POA: Diagnosis not present

## 2015-05-22 DIAGNOSIS — G309 Alzheimer's disease, unspecified: Secondary | ICD-10-CM | POA: Diagnosis not present

## 2015-05-23 DIAGNOSIS — G309 Alzheimer's disease, unspecified: Secondary | ICD-10-CM | POA: Diagnosis not present

## 2015-05-24 DIAGNOSIS — G309 Alzheimer's disease, unspecified: Secondary | ICD-10-CM | POA: Diagnosis not present

## 2015-05-25 DIAGNOSIS — N390 Urinary tract infection, site not specified: Secondary | ICD-10-CM | POA: Diagnosis not present

## 2015-05-25 DIAGNOSIS — F0390 Unspecified dementia without behavioral disturbance: Secondary | ICD-10-CM | POA: Diagnosis not present

## 2015-05-25 DIAGNOSIS — K219 Gastro-esophageal reflux disease without esophagitis: Secondary | ICD-10-CM | POA: Diagnosis not present

## 2015-05-25 DIAGNOSIS — G309 Alzheimer's disease, unspecified: Secondary | ICD-10-CM | POA: Diagnosis not present

## 2015-05-26 DIAGNOSIS — G309 Alzheimer's disease, unspecified: Secondary | ICD-10-CM | POA: Diagnosis not present

## 2015-05-27 DIAGNOSIS — G309 Alzheimer's disease, unspecified: Secondary | ICD-10-CM | POA: Diagnosis not present

## 2015-05-28 DIAGNOSIS — G309 Alzheimer's disease, unspecified: Secondary | ICD-10-CM | POA: Diagnosis not present

## 2015-05-29 DIAGNOSIS — G309 Alzheimer's disease, unspecified: Secondary | ICD-10-CM | POA: Diagnosis not present

## 2015-05-30 DIAGNOSIS — G309 Alzheimer's disease, unspecified: Secondary | ICD-10-CM | POA: Diagnosis not present

## 2015-05-31 DIAGNOSIS — G309 Alzheimer's disease, unspecified: Secondary | ICD-10-CM | POA: Diagnosis not present

## 2015-06-01 DIAGNOSIS — G309 Alzheimer's disease, unspecified: Secondary | ICD-10-CM | POA: Diagnosis not present

## 2015-06-01 DIAGNOSIS — D518 Other vitamin B12 deficiency anemias: Secondary | ICD-10-CM | POA: Diagnosis not present

## 2015-06-01 DIAGNOSIS — E038 Other specified hypothyroidism: Secondary | ICD-10-CM | POA: Diagnosis not present

## 2015-06-01 DIAGNOSIS — K210 Gastro-esophageal reflux disease with esophagitis: Secondary | ICD-10-CM | POA: Diagnosis not present

## 2015-06-01 DIAGNOSIS — R221 Localized swelling, mass and lump, neck: Secondary | ICD-10-CM | POA: Diagnosis not present

## 2015-06-01 DIAGNOSIS — R0989 Other specified symptoms and signs involving the circulatory and respiratory systems: Secondary | ICD-10-CM | POA: Diagnosis not present

## 2015-06-01 DIAGNOSIS — F0390 Unspecified dementia without behavioral disturbance: Secondary | ICD-10-CM | POA: Diagnosis not present

## 2015-06-01 DIAGNOSIS — R1310 Dysphagia, unspecified: Secondary | ICD-10-CM | POA: Diagnosis not present

## 2015-06-01 DIAGNOSIS — E559 Vitamin D deficiency, unspecified: Secondary | ICD-10-CM | POA: Diagnosis not present

## 2015-06-01 DIAGNOSIS — E782 Mixed hyperlipidemia: Secondary | ICD-10-CM | POA: Diagnosis not present

## 2015-06-01 DIAGNOSIS — R1084 Generalized abdominal pain: Secondary | ICD-10-CM | POA: Diagnosis not present

## 2015-06-01 DIAGNOSIS — I10 Essential (primary) hypertension: Secondary | ICD-10-CM | POA: Diagnosis not present

## 2015-06-01 DIAGNOSIS — E1165 Type 2 diabetes mellitus with hyperglycemia: Secondary | ICD-10-CM | POA: Diagnosis not present

## 2015-06-01 DIAGNOSIS — Z79899 Other long term (current) drug therapy: Secondary | ICD-10-CM | POA: Diagnosis not present

## 2015-06-02 DIAGNOSIS — G309 Alzheimer's disease, unspecified: Secondary | ICD-10-CM | POA: Diagnosis not present

## 2015-06-03 DIAGNOSIS — G309 Alzheimer's disease, unspecified: Secondary | ICD-10-CM | POA: Diagnosis not present

## 2015-06-04 DIAGNOSIS — G309 Alzheimer's disease, unspecified: Secondary | ICD-10-CM | POA: Diagnosis not present

## 2015-06-05 DIAGNOSIS — G309 Alzheimer's disease, unspecified: Secondary | ICD-10-CM | POA: Diagnosis not present

## 2015-06-06 DIAGNOSIS — G309 Alzheimer's disease, unspecified: Secondary | ICD-10-CM | POA: Diagnosis not present

## 2015-06-07 DIAGNOSIS — G309 Alzheimer's disease, unspecified: Secondary | ICD-10-CM | POA: Diagnosis not present

## 2015-06-08 DIAGNOSIS — G309 Alzheimer's disease, unspecified: Secondary | ICD-10-CM | POA: Diagnosis not present

## 2015-06-09 DIAGNOSIS — G309 Alzheimer's disease, unspecified: Secondary | ICD-10-CM | POA: Diagnosis not present

## 2015-06-10 DIAGNOSIS — G309 Alzheimer's disease, unspecified: Secondary | ICD-10-CM | POA: Diagnosis not present

## 2015-06-11 DIAGNOSIS — G309 Alzheimer's disease, unspecified: Secondary | ICD-10-CM | POA: Diagnosis not present

## 2015-06-12 DIAGNOSIS — G309 Alzheimer's disease, unspecified: Secondary | ICD-10-CM | POA: Diagnosis not present

## 2015-06-13 DIAGNOSIS — G309 Alzheimer's disease, unspecified: Secondary | ICD-10-CM | POA: Diagnosis not present

## 2015-06-14 DIAGNOSIS — G309 Alzheimer's disease, unspecified: Secondary | ICD-10-CM | POA: Diagnosis not present

## 2015-06-15 DIAGNOSIS — G309 Alzheimer's disease, unspecified: Secondary | ICD-10-CM | POA: Diagnosis not present

## 2015-06-16 DIAGNOSIS — G309 Alzheimer's disease, unspecified: Secondary | ICD-10-CM | POA: Diagnosis not present

## 2015-06-17 DIAGNOSIS — G309 Alzheimer's disease, unspecified: Secondary | ICD-10-CM | POA: Diagnosis not present

## 2015-06-18 DIAGNOSIS — G309 Alzheimer's disease, unspecified: Secondary | ICD-10-CM | POA: Diagnosis not present

## 2015-06-19 DIAGNOSIS — G309 Alzheimer's disease, unspecified: Secondary | ICD-10-CM | POA: Diagnosis not present

## 2015-06-20 DIAGNOSIS — G309 Alzheimer's disease, unspecified: Secondary | ICD-10-CM | POA: Diagnosis not present

## 2015-06-21 DIAGNOSIS — G309 Alzheimer's disease, unspecified: Secondary | ICD-10-CM | POA: Diagnosis not present

## 2015-06-22 DIAGNOSIS — G309 Alzheimer's disease, unspecified: Secondary | ICD-10-CM | POA: Diagnosis not present

## 2015-06-23 DIAGNOSIS — G309 Alzheimer's disease, unspecified: Secondary | ICD-10-CM | POA: Diagnosis not present

## 2015-06-24 DIAGNOSIS — G309 Alzheimer's disease, unspecified: Secondary | ICD-10-CM | POA: Diagnosis not present

## 2015-06-25 DIAGNOSIS — G309 Alzheimer's disease, unspecified: Secondary | ICD-10-CM | POA: Diagnosis not present

## 2015-06-26 DIAGNOSIS — M79671 Pain in right foot: Secondary | ICD-10-CM | POA: Diagnosis not present

## 2015-06-26 DIAGNOSIS — R262 Difficulty in walking, not elsewhere classified: Secondary | ICD-10-CM | POA: Diagnosis not present

## 2015-06-26 DIAGNOSIS — B351 Tinea unguium: Secondary | ICD-10-CM | POA: Diagnosis not present

## 2015-06-26 DIAGNOSIS — G309 Alzheimer's disease, unspecified: Secondary | ICD-10-CM | POA: Diagnosis not present

## 2015-06-26 DIAGNOSIS — M79672 Pain in left foot: Secondary | ICD-10-CM | POA: Diagnosis not present

## 2015-06-27 DIAGNOSIS — G309 Alzheimer's disease, unspecified: Secondary | ICD-10-CM | POA: Diagnosis not present

## 2015-06-28 DIAGNOSIS — G309 Alzheimer's disease, unspecified: Secondary | ICD-10-CM | POA: Diagnosis not present

## 2015-06-29 DIAGNOSIS — G309 Alzheimer's disease, unspecified: Secondary | ICD-10-CM | POA: Diagnosis not present

## 2015-06-30 DIAGNOSIS — N184 Chronic kidney disease, stage 4 (severe): Secondary | ICD-10-CM | POA: Diagnosis not present

## 2015-06-30 DIAGNOSIS — G309 Alzheimer's disease, unspecified: Secondary | ICD-10-CM | POA: Diagnosis not present

## 2015-06-30 DIAGNOSIS — R32 Unspecified urinary incontinence: Secondary | ICD-10-CM | POA: Diagnosis not present

## 2015-07-01 DIAGNOSIS — G309 Alzheimer's disease, unspecified: Secondary | ICD-10-CM | POA: Diagnosis not present

## 2015-07-02 DIAGNOSIS — G309 Alzheimer's disease, unspecified: Secondary | ICD-10-CM | POA: Diagnosis not present

## 2015-07-03 DIAGNOSIS — G309 Alzheimer's disease, unspecified: Secondary | ICD-10-CM | POA: Diagnosis not present

## 2015-07-04 DIAGNOSIS — G309 Alzheimer's disease, unspecified: Secondary | ICD-10-CM | POA: Diagnosis not present

## 2015-07-05 DIAGNOSIS — G309 Alzheimer's disease, unspecified: Secondary | ICD-10-CM | POA: Diagnosis not present

## 2015-07-06 DIAGNOSIS — G309 Alzheimer's disease, unspecified: Secondary | ICD-10-CM | POA: Diagnosis not present

## 2015-07-07 DIAGNOSIS — G309 Alzheimer's disease, unspecified: Secondary | ICD-10-CM | POA: Diagnosis not present

## 2015-07-08 DIAGNOSIS — G309 Alzheimer's disease, unspecified: Secondary | ICD-10-CM | POA: Diagnosis not present

## 2015-07-09 DIAGNOSIS — G309 Alzheimer's disease, unspecified: Secondary | ICD-10-CM | POA: Diagnosis not present

## 2015-07-10 DIAGNOSIS — G309 Alzheimer's disease, unspecified: Secondary | ICD-10-CM | POA: Diagnosis not present

## 2015-07-11 DIAGNOSIS — G309 Alzheimer's disease, unspecified: Secondary | ICD-10-CM | POA: Diagnosis not present

## 2015-07-12 DIAGNOSIS — G309 Alzheimer's disease, unspecified: Secondary | ICD-10-CM | POA: Diagnosis not present

## 2015-07-13 DIAGNOSIS — K219 Gastro-esophageal reflux disease without esophagitis: Secondary | ICD-10-CM | POA: Diagnosis not present

## 2015-07-13 DIAGNOSIS — G309 Alzheimer's disease, unspecified: Secondary | ICD-10-CM | POA: Diagnosis not present

## 2015-07-13 DIAGNOSIS — E784 Other hyperlipidemia: Secondary | ICD-10-CM | POA: Diagnosis not present

## 2015-07-13 DIAGNOSIS — D518 Other vitamin B12 deficiency anemias: Secondary | ICD-10-CM | POA: Diagnosis not present

## 2015-07-13 DIAGNOSIS — J3089 Other allergic rhinitis: Secondary | ICD-10-CM | POA: Diagnosis not present

## 2015-07-14 DIAGNOSIS — G309 Alzheimer's disease, unspecified: Secondary | ICD-10-CM | POA: Diagnosis not present

## 2015-07-15 DIAGNOSIS — G309 Alzheimer's disease, unspecified: Secondary | ICD-10-CM | POA: Diagnosis not present

## 2015-07-16 DIAGNOSIS — G309 Alzheimer's disease, unspecified: Secondary | ICD-10-CM | POA: Diagnosis not present

## 2015-07-17 DIAGNOSIS — G309 Alzheimer's disease, unspecified: Secondary | ICD-10-CM | POA: Diagnosis not present

## 2015-07-18 DIAGNOSIS — G309 Alzheimer's disease, unspecified: Secondary | ICD-10-CM | POA: Diagnosis not present

## 2015-07-19 DIAGNOSIS — G309 Alzheimer's disease, unspecified: Secondary | ICD-10-CM | POA: Diagnosis not present

## 2015-07-20 DIAGNOSIS — G309 Alzheimer's disease, unspecified: Secondary | ICD-10-CM | POA: Diagnosis not present

## 2015-07-21 DIAGNOSIS — G309 Alzheimer's disease, unspecified: Secondary | ICD-10-CM | POA: Diagnosis not present

## 2015-07-22 DIAGNOSIS — G309 Alzheimer's disease, unspecified: Secondary | ICD-10-CM | POA: Diagnosis not present

## 2015-07-23 DIAGNOSIS — G309 Alzheimer's disease, unspecified: Secondary | ICD-10-CM | POA: Diagnosis not present

## 2015-07-24 DIAGNOSIS — G309 Alzheimer's disease, unspecified: Secondary | ICD-10-CM | POA: Diagnosis not present

## 2015-07-25 DIAGNOSIS — G309 Alzheimer's disease, unspecified: Secondary | ICD-10-CM | POA: Diagnosis not present

## 2015-07-26 DIAGNOSIS — G309 Alzheimer's disease, unspecified: Secondary | ICD-10-CM | POA: Diagnosis not present

## 2015-07-27 DIAGNOSIS — G309 Alzheimer's disease, unspecified: Secondary | ICD-10-CM | POA: Diagnosis not present

## 2015-07-28 DIAGNOSIS — G309 Alzheimer's disease, unspecified: Secondary | ICD-10-CM | POA: Diagnosis not present

## 2015-07-29 DIAGNOSIS — G309 Alzheimer's disease, unspecified: Secondary | ICD-10-CM | POA: Diagnosis not present

## 2015-07-30 DIAGNOSIS — G309 Alzheimer's disease, unspecified: Secondary | ICD-10-CM | POA: Diagnosis not present

## 2015-07-31 DIAGNOSIS — G309 Alzheimer's disease, unspecified: Secondary | ICD-10-CM | POA: Diagnosis not present

## 2015-08-01 DIAGNOSIS — G309 Alzheimer's disease, unspecified: Secondary | ICD-10-CM | POA: Diagnosis not present

## 2015-08-02 DIAGNOSIS — N184 Chronic kidney disease, stage 4 (severe): Secondary | ICD-10-CM | POA: Diagnosis not present

## 2015-08-02 DIAGNOSIS — R32 Unspecified urinary incontinence: Secondary | ICD-10-CM | POA: Diagnosis not present

## 2015-08-02 DIAGNOSIS — G309 Alzheimer's disease, unspecified: Secondary | ICD-10-CM | POA: Diagnosis not present

## 2015-08-03 DIAGNOSIS — G309 Alzheimer's disease, unspecified: Secondary | ICD-10-CM | POA: Diagnosis not present

## 2015-08-04 DIAGNOSIS — G309 Alzheimer's disease, unspecified: Secondary | ICD-10-CM | POA: Diagnosis not present

## 2015-08-05 DIAGNOSIS — G309 Alzheimer's disease, unspecified: Secondary | ICD-10-CM | POA: Diagnosis not present

## 2015-08-06 DIAGNOSIS — G309 Alzheimer's disease, unspecified: Secondary | ICD-10-CM | POA: Diagnosis not present

## 2015-08-14 DIAGNOSIS — G309 Alzheimer's disease, unspecified: Secondary | ICD-10-CM | POA: Diagnosis not present

## 2015-08-15 DIAGNOSIS — G309 Alzheimer's disease, unspecified: Secondary | ICD-10-CM | POA: Diagnosis not present

## 2015-08-16 DIAGNOSIS — G309 Alzheimer's disease, unspecified: Secondary | ICD-10-CM | POA: Diagnosis not present

## 2015-08-17 DIAGNOSIS — G309 Alzheimer's disease, unspecified: Secondary | ICD-10-CM | POA: Diagnosis not present

## 2015-08-18 DIAGNOSIS — G309 Alzheimer's disease, unspecified: Secondary | ICD-10-CM | POA: Diagnosis not present

## 2015-08-19 DIAGNOSIS — G309 Alzheimer's disease, unspecified: Secondary | ICD-10-CM | POA: Diagnosis not present

## 2015-08-20 DIAGNOSIS — G309 Alzheimer's disease, unspecified: Secondary | ICD-10-CM | POA: Diagnosis not present

## 2015-08-24 DIAGNOSIS — R05 Cough: Secondary | ICD-10-CM | POA: Diagnosis not present

## 2015-08-24 DIAGNOSIS — R600 Localized edema: Secondary | ICD-10-CM | POA: Diagnosis not present

## 2015-08-25 DIAGNOSIS — R609 Edema, unspecified: Secondary | ICD-10-CM | POA: Diagnosis not present

## 2015-08-25 DIAGNOSIS — R05 Cough: Secondary | ICD-10-CM | POA: Diagnosis not present

## 2015-08-31 DIAGNOSIS — R32 Unspecified urinary incontinence: Secondary | ICD-10-CM | POA: Diagnosis not present

## 2015-08-31 DIAGNOSIS — N184 Chronic kidney disease, stage 4 (severe): Secondary | ICD-10-CM | POA: Diagnosis not present

## 2015-09-14 DIAGNOSIS — R600 Localized edema: Secondary | ICD-10-CM | POA: Diagnosis not present

## 2015-09-14 DIAGNOSIS — R1032 Left lower quadrant pain: Secondary | ICD-10-CM | POA: Diagnosis not present

## 2015-09-14 DIAGNOSIS — N390 Urinary tract infection, site not specified: Secondary | ICD-10-CM | POA: Diagnosis not present

## 2015-09-15 DIAGNOSIS — R109 Unspecified abdominal pain: Secondary | ICD-10-CM | POA: Diagnosis not present

## 2015-09-28 DIAGNOSIS — K5909 Other constipation: Secondary | ICD-10-CM | POA: Diagnosis not present

## 2015-09-28 DIAGNOSIS — G308 Other Alzheimer's disease: Secondary | ICD-10-CM | POA: Diagnosis not present

## 2015-09-28 DIAGNOSIS — N390 Urinary tract infection, site not specified: Secondary | ICD-10-CM | POA: Diagnosis not present

## 2015-09-28 DIAGNOSIS — R531 Weakness: Secondary | ICD-10-CM | POA: Diagnosis not present

## 2015-09-29 DIAGNOSIS — L02612 Cutaneous abscess of left foot: Secondary | ICD-10-CM | POA: Diagnosis not present

## 2015-09-29 DIAGNOSIS — I739 Peripheral vascular disease, unspecified: Secondary | ICD-10-CM | POA: Diagnosis not present

## 2015-09-29 DIAGNOSIS — B351 Tinea unguium: Secondary | ICD-10-CM | POA: Diagnosis not present

## 2015-09-29 DIAGNOSIS — M79675 Pain in left toe(s): Secondary | ICD-10-CM | POA: Diagnosis not present

## 2015-10-03 DIAGNOSIS — J449 Chronic obstructive pulmonary disease, unspecified: Secondary | ICD-10-CM | POA: Diagnosis not present

## 2015-10-03 DIAGNOSIS — R32 Unspecified urinary incontinence: Secondary | ICD-10-CM | POA: Diagnosis not present

## 2015-10-03 DIAGNOSIS — N184 Chronic kidney disease, stage 4 (severe): Secondary | ICD-10-CM | POA: Diagnosis not present

## 2015-10-03 DIAGNOSIS — I10 Essential (primary) hypertension: Secondary | ICD-10-CM | POA: Diagnosis not present

## 2015-10-20 DIAGNOSIS — R945 Abnormal results of liver function studies: Secondary | ICD-10-CM | POA: Diagnosis not present

## 2015-10-26 DIAGNOSIS — M79604 Pain in right leg: Secondary | ICD-10-CM | POA: Diagnosis not present

## 2015-10-26 DIAGNOSIS — R600 Localized edema: Secondary | ICD-10-CM | POA: Diagnosis not present

## 2015-11-02 DIAGNOSIS — I10 Essential (primary) hypertension: Secondary | ICD-10-CM | POA: Diagnosis not present

## 2015-11-02 DIAGNOSIS — I509 Heart failure, unspecified: Secondary | ICD-10-CM | POA: Diagnosis not present

## 2015-11-09 DIAGNOSIS — R600 Localized edema: Secondary | ICD-10-CM | POA: Diagnosis not present

## 2015-11-23 DIAGNOSIS — K219 Gastro-esophageal reflux disease without esophagitis: Secondary | ICD-10-CM | POA: Diagnosis not present

## 2015-11-23 DIAGNOSIS — R600 Localized edema: Secondary | ICD-10-CM | POA: Diagnosis not present

## 2015-11-28 DIAGNOSIS — N184 Chronic kidney disease, stage 4 (severe): Secondary | ICD-10-CM | POA: Diagnosis not present

## 2015-11-28 DIAGNOSIS — R32 Unspecified urinary incontinence: Secondary | ICD-10-CM | POA: Diagnosis not present

## 2015-12-04 DIAGNOSIS — I10 Essential (primary) hypertension: Secondary | ICD-10-CM | POA: Diagnosis not present

## 2015-12-07 DIAGNOSIS — R638 Other symptoms and signs concerning food and fluid intake: Secondary | ICD-10-CM | POA: Diagnosis not present

## 2015-12-07 DIAGNOSIS — R600 Localized edema: Secondary | ICD-10-CM | POA: Diagnosis not present

## 2015-12-07 DIAGNOSIS — I739 Peripheral vascular disease, unspecified: Secondary | ICD-10-CM | POA: Diagnosis not present

## 2015-12-29 DIAGNOSIS — M79671 Pain in right foot: Secondary | ICD-10-CM | POA: Diagnosis not present

## 2015-12-29 DIAGNOSIS — B351 Tinea unguium: Secondary | ICD-10-CM | POA: Diagnosis not present

## 2015-12-29 DIAGNOSIS — R262 Difficulty in walking, not elsewhere classified: Secondary | ICD-10-CM | POA: Diagnosis not present

## 2015-12-29 DIAGNOSIS — M79672 Pain in left foot: Secondary | ICD-10-CM | POA: Diagnosis not present

## 2016-01-01 DIAGNOSIS — D492 Neoplasm of unspecified behavior of bone, soft tissue, and skin: Secondary | ICD-10-CM | POA: Diagnosis not present

## 2016-01-01 DIAGNOSIS — R0989 Other specified symptoms and signs involving the circulatory and respiratory systems: Secondary | ICD-10-CM | POA: Diagnosis not present

## 2016-01-01 DIAGNOSIS — R221 Localized swelling, mass and lump, neck: Secondary | ICD-10-CM | POA: Diagnosis not present

## 2016-01-02 ENCOUNTER — Emergency Department (HOSPITAL_COMMUNITY)
Admission: EM | Admit: 2016-01-02 | Discharge: 2016-01-02 | Disposition: A | Discharge status: Home / Self Care | Payer: Medicare Other | Attending: Emergency Medicine | Admitting: Emergency Medicine

## 2016-01-02 ENCOUNTER — Encounter (HOSPITAL_COMMUNITY): Payer: Self-pay | Admitting: Emergency Medicine

## 2016-01-02 ENCOUNTER — Emergency Department (HOSPITAL_COMMUNITY): Payer: Medicare Other

## 2016-01-02 DIAGNOSIS — S0990XA Unspecified injury of head, initial encounter: Secondary | ICD-10-CM | POA: Diagnosis present

## 2016-01-02 DIAGNOSIS — S0083XA Contusion of other part of head, initial encounter: Secondary | ICD-10-CM | POA: Diagnosis not present

## 2016-01-02 DIAGNOSIS — Z743 Need for continuous supervision: Secondary | ICD-10-CM | POA: Diagnosis not present

## 2016-01-02 DIAGNOSIS — Y929 Unspecified place or not applicable: Secondary | ICD-10-CM | POA: Diagnosis not present

## 2016-01-02 DIAGNOSIS — Y999 Unspecified external cause status: Secondary | ICD-10-CM | POA: Diagnosis not present

## 2016-01-02 DIAGNOSIS — S098XXA Other specified injuries of head, initial encounter: Secondary | ICD-10-CM | POA: Diagnosis not present

## 2016-01-02 DIAGNOSIS — W19XXXA Unspecified fall, initial encounter: Secondary | ICD-10-CM | POA: Diagnosis not present

## 2016-01-02 DIAGNOSIS — Y939 Activity, unspecified: Secondary | ICD-10-CM | POA: Diagnosis not present

## 2016-01-02 DIAGNOSIS — Z79899 Other long term (current) drug therapy: Secondary | ICD-10-CM | POA: Insufficient documentation

## 2016-01-02 DIAGNOSIS — S161XXA Strain of muscle, fascia and tendon at neck level, initial encounter: Secondary | ICD-10-CM | POA: Diagnosis not present

## 2016-01-02 DIAGNOSIS — R279 Unspecified lack of coordination: Secondary | ICD-10-CM | POA: Diagnosis not present

## 2016-01-02 DIAGNOSIS — E785 Hyperlipidemia, unspecified: Secondary | ICD-10-CM | POA: Diagnosis not present

## 2016-01-02 DIAGNOSIS — S0093XA Contusion of unspecified part of head, initial encounter: Secondary | ICD-10-CM

## 2016-01-02 DIAGNOSIS — F0390 Unspecified dementia without behavioral disturbance: Secondary | ICD-10-CM | POA: Diagnosis not present

## 2016-01-02 DIAGNOSIS — S199XXA Unspecified injury of neck, initial encounter: Secondary | ICD-10-CM | POA: Diagnosis not present

## 2016-01-02 DIAGNOSIS — S0003XA Contusion of scalp, initial encounter: Secondary | ICD-10-CM | POA: Diagnosis not present

## 2016-01-02 DIAGNOSIS — S0091XA Abrasion of unspecified part of head, initial encounter: Secondary | ICD-10-CM | POA: Diagnosis not present

## 2016-01-02 NOTE — ED Notes (Signed)
Called [facility] and gave nursing report to staff.  Also asked about transportation back to the facility and they stated that they did not have any and requested we send her by ambulance

## 2016-01-02 NOTE — ED Provider Notes (Signed)
[facility] DEPT Provider Note   CSN: [id number] Arrival date & time: 01/02/16  1901  First Provider Contact:  First MD Initiated Contact with Patient 01/02/16 1935        History   Chief Complaint Chief Complaint  Patient presents with  . Fall   LEVEL 5 CAVEAT DUE TO DEMENTIA  HPI [name] is a 82 y.o. female.  The history is provided by the patient and the nursing home. The history is limited by the condition of the patient.  Fall  This is a new problem. Episode onset: UNKNOWN. The problem occurs constantly. The problem has not changed since onset.Associated symptoms include headaches. Nothing aggravates the symptoms. Nothing relieves the symptoms.   Per nurse at [facility] point (i spoke to her via phone), pt was found in the floor Pt states she hit her head and patient was moaning Pt nonverbal, she will respond at times on typical day Pt is able to stand on her own  She is not on anticoagulants Past Medical History:  Diagnosis Date  . Dementia   . GERD (gastroesophageal reflux disease)   . Hyperlipidemia     Patient Active Problem List   Diagnosis Date Noted  . Dementia     Past Surgical History:  Procedure Laterality Date  . ABDOMINAL HYSTERECTOMY    . CHOLECYSTECTOMY      OB History    No data available       Home Medications    Prior to Admission medications   Medication Sig Start Date End Date Taking? Authorizing Provider  atorvastatin (LIPITOR) 20 MG tablet Take 1 tablet (20 mg total) by mouth daily. 02/15/13   Erby Pian, FNP  donepezil (ARICEPT) 10 MG tablet Take one tablet by mouth every night 03/12/13   Erby Pian, FNP    Family History History reviewed. No pertinent family history.  Social History Social History  Substance Use Topics  . Smoking status: Never Smoker  . Smokeless tobacco: Not on file  . Alcohol use No     Allergies   Review of patient's allergies indicates no known allergies.   Review of  Systems Review of Systems  Unable to perform ROS: Dementia  Neurological: Positive for headaches.     Physical Exam Updated Vital Signs BP 125/80 (BP Location: Left Arm)   Pulse 78   Temp 97.9 F (36.6 C) (Oral)   Resp 16   SpO2 100%   Physical Exam CONSTITUTIONAL: elderly, frail HEAD: hematoma to forehead, no other signs of trauma EYES: forcibly keeping eyes closed ENMT: Mucous membranes moist NECK: supple no meningeal signs SPINE/BACK:cervical spine tenderness, No bruising/crepitance/stepoffs noted to spine CV: S1/S2 noted LUNGS: Lungs are clear to auscultation bilaterally, no apparent distress ABDOMEN: soft, nontender GU:no cva tenderness NEURO: Pt is resting with eyes closed.  She will respond to voice but is otherwise nonverbal.  No focal weakness noted EXTREMITIES: pulses normal/equal, full ROM, All extremities/joints palpated/ranged and nontender SKIN: warm, color normal  ED Treatments / Results  Labs (all labs ordered are listed, but only abnormal results are displayed) Labs Reviewed - No data to display  EKG  EKG Interpretation None       Radiology Ct Head Wo Contrast  Result Date: 01/02/2016 CLINICAL DATA:  Unwitnessed fall now with bruising to the forehead. History dementia. EXAM: CT HEAD WITHOUT CONTRAST CT CERVICAL SPINE WITHOUT CONTRAST TECHNIQUE: Multidetector CT imaging of the head and cervical spine was performed following the standard protocol without intravenous  contrast. Multiplanar CT image reconstructions of the cervical spine were also generated. COMPARISON:  None. FINDINGS: CT HEAD FINDINGS Regional soft tissues appear normal. No radiopaque foreign body. No displaced calvarial fracture. Advanced atrophy with sulcal prominence centralized volume loss with commensurate ex vacuo dilatation the ventricular system. Rather extensive periventricular hypodensities compatible microvascular ischemic disease. Given background parenchymal abnormalities, there  is no CT evidence of superimposed acute large territory infarct. No intraparenchymal or extra-axial mass or hemorrhage. Normal configuration of the ventricles and basilar cisterns. No midline shift. Limited visualization the paranasal sinuses and mastoid air cells is normal. No air-fluid levels. Debris is seen within the bilateral external auditory canals. CT CERVICAL SPINE FINDINGS C1 to the superior endplate of T2 is imaged. There is minimal (approximately 2-3 mm) of anterolisthesis of C4 upon C5, C5 upon C6 and C6 upon C7. No retrolisthesis. The bilateral facets are normally aligned. The dens is normally positioned between the lateral masses of C1. Moderate degenerative change of the atlantodental articulation. No fracture or static subluxation of the cervical spine. Cervical vertebral body heights are preserved. Prevertebral soft tissues are normal. Mild multilevel cervical spine DDD, worse at C4-C5, C5-C6 and C6-C7 with disc space height loss. No bulky cervical lymphadenopathy on this noncontrast examination. Calcified atherosclerotic plaque within the bilateral carotid bulbs. Heterogeneous appearance of the thyroid gland without discrete nodule on this noncontrast examination. Limited visualization lung apices demonstrates biapical centrilobular emphysematous change. IMPRESSION: 1. Advanced atrophy and microvascular ischemic disease without acute intracranial process. 2. No fracture or static subluxation of the cervical spine. 3. Mild multilevel cervical spine DDD. 4. Calcified plaque with the bilateral carotid bulbs. Further evaluation with dedicated nonemergent carotid Doppler ultrasound could be performed as clinically indicated. Electronically Signed   By: Sandi Mariscal M.D.   On: 01/02/2016 21:06  Ct Cervical Spine Wo Contrast  Result Date: 01/02/2016 CLINICAL DATA:  Unwitnessed fall now with bruising to the forehead. History dementia. EXAM: CT HEAD WITHOUT CONTRAST CT CERVICAL SPINE WITHOUT CONTRAST  TECHNIQUE: Multidetector CT imaging of the head and cervical spine was performed following the standard protocol without intravenous contrast. Multiplanar CT image reconstructions of the cervical spine were also generated. COMPARISON:  None. FINDINGS: CT HEAD FINDINGS Regional soft tissues appear normal. No radiopaque foreign body. No displaced calvarial fracture. Advanced atrophy with sulcal prominence centralized volume loss with commensurate ex vacuo dilatation the ventricular system. Rather extensive periventricular hypodensities compatible microvascular ischemic disease. Given background parenchymal abnormalities, there is no CT evidence of superimposed acute large territory infarct. No intraparenchymal or extra-axial mass or hemorrhage. Normal configuration of the ventricles and basilar cisterns. No midline shift. Limited visualization the paranasal sinuses and mastoid air cells is normal. No air-fluid levels. Debris is seen within the bilateral external auditory canals. CT CERVICAL SPINE FINDINGS C1 to the superior endplate of T2 is imaged. There is minimal (approximately 2-3 mm) of anterolisthesis of C4 upon C5, C5 upon C6 and C6 upon C7. No retrolisthesis. The bilateral facets are normally aligned. The dens is normally positioned between the lateral masses of C1. Moderate degenerative change of the atlantodental articulation. No fracture or static subluxation of the cervical spine. Cervical vertebral body heights are preserved. Prevertebral soft tissues are normal. Mild multilevel cervical spine DDD, worse at C4-C5, C5-C6 and C6-C7 with disc space height loss. No bulky cervical lymphadenopathy on this noncontrast examination. Calcified atherosclerotic plaque within the bilateral carotid bulbs. Heterogeneous appearance of the thyroid gland without discrete nodule on this noncontrast examination. Limited visualization lung apices  demonstrates biapical centrilobular emphysematous change. IMPRESSION: 1.  Advanced atrophy and microvascular ischemic disease without acute intracranial process. 2. No fracture or static subluxation of the cervical spine. 3. Mild multilevel cervical spine DDD. 4. Calcified plaque with the bilateral carotid bulbs. Further evaluation with dedicated nonemergent carotid Doppler ultrasound could be performed as clinically indicated. Electronically Signed   By: Sandi Mariscal M.D.   On: 01/02/2016 21:06   Procedures Procedures (including critical care time)  Medications Ordered in ED Medications - No data to display   Initial Impression / Assessment and Plan / ED Course  I have reviewed the triage vital signs and the nursing notes.    Clinical Course    Pt stable Imaging negative She is ambulatory, awake/alert D/c home   Final Clinical Impressions(s) / ED Diagnoses   Final diagnoses:  Fall, initial encounter  Head contusion, initial encounter  Cervical strain, acute, initial encounter    New Prescriptions New Prescriptions   No medications on file     Ripley Fraise, MD 01/02/16 2225

## 2016-01-02 NOTE — ED Triage Notes (Signed)
Pt had unwitnessed fall at [employer]. Pt has hx of dementia and will answer yes or no to questions at times.

## 2016-01-11 DIAGNOSIS — G309 Alzheimer's disease, unspecified: Secondary | ICD-10-CM | POA: Diagnosis not present

## 2016-01-11 DIAGNOSIS — N289 Disorder of kidney and ureter, unspecified: Secondary | ICD-10-CM | POA: Diagnosis not present

## 2016-01-11 DIAGNOSIS — I739 Peripheral vascular disease, unspecified: Secondary | ICD-10-CM | POA: Diagnosis not present

## 2016-01-11 DIAGNOSIS — R600 Localized edema: Secondary | ICD-10-CM | POA: Diagnosis not present

## 2016-01-16 DIAGNOSIS — E782 Mixed hyperlipidemia: Secondary | ICD-10-CM | POA: Diagnosis not present

## 2016-01-16 DIAGNOSIS — E559 Vitamin D deficiency, unspecified: Secondary | ICD-10-CM | POA: Diagnosis not present

## 2016-01-16 DIAGNOSIS — Z79899 Other long term (current) drug therapy: Secondary | ICD-10-CM | POA: Diagnosis not present

## 2016-01-16 DIAGNOSIS — E038 Other specified hypothyroidism: Secondary | ICD-10-CM | POA: Diagnosis not present

## 2016-01-16 DIAGNOSIS — D518 Other vitamin B12 deficiency anemias: Secondary | ICD-10-CM | POA: Diagnosis not present

## 2016-01-16 DIAGNOSIS — E119 Type 2 diabetes mellitus without complications: Secondary | ICD-10-CM | POA: Diagnosis not present

## 2016-02-08 DIAGNOSIS — R600 Localized edema: Secondary | ICD-10-CM | POA: Diagnosis not present

## 2016-02-08 DIAGNOSIS — E784 Other hyperlipidemia: Secondary | ICD-10-CM | POA: Diagnosis not present

## 2016-02-08 DIAGNOSIS — S161XXS Strain of muscle, fascia and tendon at neck level, sequela: Secondary | ICD-10-CM | POA: Diagnosis not present

## 2016-02-08 DIAGNOSIS — I10 Essential (primary) hypertension: Secondary | ICD-10-CM | POA: Diagnosis not present

## 2016-03-07 DIAGNOSIS — D519 Vitamin B12 deficiency anemia, unspecified: Secondary | ICD-10-CM | POA: Diagnosis not present

## 2016-03-07 DIAGNOSIS — K219 Gastro-esophageal reflux disease without esophagitis: Secondary | ICD-10-CM | POA: Diagnosis not present

## 2016-03-07 DIAGNOSIS — R600 Localized edema: Secondary | ICD-10-CM | POA: Diagnosis not present

## 2016-03-07 DIAGNOSIS — N289 Disorder of kidney and ureter, unspecified: Secondary | ICD-10-CM | POA: Diagnosis not present

## 2016-04-02 DIAGNOSIS — Z79899 Other long term (current) drug therapy: Secondary | ICD-10-CM | POA: Diagnosis not present

## 2016-04-02 DIAGNOSIS — E038 Other specified hypothyroidism: Secondary | ICD-10-CM | POA: Diagnosis not present

## 2016-04-02 DIAGNOSIS — E119 Type 2 diabetes mellitus without complications: Secondary | ICD-10-CM | POA: Diagnosis not present

## 2016-04-02 DIAGNOSIS — E782 Mixed hyperlipidemia: Secondary | ICD-10-CM | POA: Diagnosis not present

## 2016-04-02 DIAGNOSIS — E559 Vitamin D deficiency, unspecified: Secondary | ICD-10-CM | POA: Diagnosis not present

## 2016-04-02 DIAGNOSIS — D518 Other vitamin B12 deficiency anemias: Secondary | ICD-10-CM | POA: Diagnosis not present

## 2016-04-04 DIAGNOSIS — I739 Peripheral vascular disease, unspecified: Secondary | ICD-10-CM | POA: Diagnosis not present

## 2016-04-04 DIAGNOSIS — N184 Chronic kidney disease, stage 4 (severe): Secondary | ICD-10-CM | POA: Diagnosis not present

## 2016-04-04 DIAGNOSIS — R600 Localized edema: Secondary | ICD-10-CM | POA: Diagnosis not present

## 2016-04-04 DIAGNOSIS — K219 Gastro-esophageal reflux disease without esophagitis: Secondary | ICD-10-CM | POA: Diagnosis not present

## 2016-04-04 DIAGNOSIS — E784 Other hyperlipidemia: Secondary | ICD-10-CM | POA: Diagnosis not present

## 2016-05-09 DIAGNOSIS — K219 Gastro-esophageal reflux disease without esophagitis: Secondary | ICD-10-CM | POA: Diagnosis not present

## 2016-05-09 DIAGNOSIS — E784 Other hyperlipidemia: Secondary | ICD-10-CM | POA: Diagnosis not present

## 2016-05-09 DIAGNOSIS — G309 Alzheimer's disease, unspecified: Secondary | ICD-10-CM | POA: Diagnosis not present

## 2016-05-09 DIAGNOSIS — I739 Peripheral vascular disease, unspecified: Secondary | ICD-10-CM | POA: Diagnosis not present

## 2016-05-29 DIAGNOSIS — N184 Chronic kidney disease, stage 4 (severe): Secondary | ICD-10-CM | POA: Diagnosis not present

## 2016-06-06 DIAGNOSIS — I10 Essential (primary) hypertension: Secondary | ICD-10-CM | POA: Diagnosis not present

## 2016-06-06 DIAGNOSIS — K219 Gastro-esophageal reflux disease without esophagitis: Secondary | ICD-10-CM | POA: Diagnosis not present

## 2016-06-06 DIAGNOSIS — I739 Peripheral vascular disease, unspecified: Secondary | ICD-10-CM | POA: Diagnosis not present

## 2016-06-06 DIAGNOSIS — G309 Alzheimer's disease, unspecified: Secondary | ICD-10-CM | POA: Diagnosis not present

## 2016-07-02 DIAGNOSIS — E038 Other specified hypothyroidism: Secondary | ICD-10-CM | POA: Diagnosis not present

## 2016-07-02 DIAGNOSIS — E559 Vitamin D deficiency, unspecified: Secondary | ICD-10-CM | POA: Diagnosis not present

## 2016-07-02 DIAGNOSIS — E782 Mixed hyperlipidemia: Secondary | ICD-10-CM | POA: Diagnosis not present

## 2016-07-02 DIAGNOSIS — D518 Other vitamin B12 deficiency anemias: Secondary | ICD-10-CM | POA: Diagnosis not present

## 2016-07-02 DIAGNOSIS — E1165 Type 2 diabetes mellitus with hyperglycemia: Secondary | ICD-10-CM | POA: Diagnosis not present

## 2016-07-02 DIAGNOSIS — Z79899 Other long term (current) drug therapy: Secondary | ICD-10-CM | POA: Diagnosis not present

## 2016-07-03 DIAGNOSIS — N184 Chronic kidney disease, stage 4 (severe): Secondary | ICD-10-CM | POA: Diagnosis not present

## 2016-07-10 DIAGNOSIS — I739 Peripheral vascular disease, unspecified: Secondary | ICD-10-CM | POA: Diagnosis not present

## 2016-07-10 DIAGNOSIS — R0989 Other specified symptoms and signs involving the circulatory and respiratory systems: Secondary | ICD-10-CM | POA: Diagnosis not present

## 2016-07-11 DIAGNOSIS — G309 Alzheimer's disease, unspecified: Secondary | ICD-10-CM | POA: Diagnosis not present

## 2016-07-11 DIAGNOSIS — I10 Essential (primary) hypertension: Secondary | ICD-10-CM | POA: Diagnosis not present

## 2016-07-11 DIAGNOSIS — K219 Gastro-esophageal reflux disease without esophagitis: Secondary | ICD-10-CM | POA: Diagnosis not present

## 2016-07-11 DIAGNOSIS — R600 Localized edema: Secondary | ICD-10-CM | POA: Diagnosis not present

## 2016-07-11 DIAGNOSIS — I739 Peripheral vascular disease, unspecified: Secondary | ICD-10-CM | POA: Diagnosis not present

## 2016-07-11 DIAGNOSIS — B351 Tinea unguium: Secondary | ICD-10-CM | POA: Diagnosis not present

## 2016-07-12 DIAGNOSIS — R221 Localized swelling, mass and lump, neck: Secondary | ICD-10-CM | POA: Diagnosis not present

## 2016-07-12 DIAGNOSIS — M79609 Pain in unspecified limb: Secondary | ICD-10-CM | POA: Diagnosis not present

## 2016-08-01 DIAGNOSIS — N184 Chronic kidney disease, stage 4 (severe): Secondary | ICD-10-CM | POA: Diagnosis not present

## 2016-08-08 DIAGNOSIS — G309 Alzheimer's disease, unspecified: Secondary | ICD-10-CM | POA: Diagnosis not present

## 2016-08-08 DIAGNOSIS — R638 Other symptoms and signs concerning food and fluid intake: Secondary | ICD-10-CM | POA: Diagnosis not present

## 2016-08-08 DIAGNOSIS — K219 Gastro-esophageal reflux disease without esophagitis: Secondary | ICD-10-CM | POA: Diagnosis not present

## 2016-08-08 DIAGNOSIS — R600 Localized edema: Secondary | ICD-10-CM | POA: Diagnosis not present

## 2016-08-09 DIAGNOSIS — R011 Cardiac murmur, unspecified: Secondary | ICD-10-CM | POA: Diagnosis not present

## 2016-08-30 DIAGNOSIS — E1165 Type 2 diabetes mellitus with hyperglycemia: Secondary | ICD-10-CM | POA: Diagnosis not present

## 2016-08-30 DIAGNOSIS — M21611 Bunion of right foot: Secondary | ICD-10-CM | POA: Diagnosis not present

## 2016-08-30 DIAGNOSIS — L84 Corns and callosities: Secondary | ICD-10-CM | POA: Diagnosis not present

## 2016-08-30 DIAGNOSIS — L602 Onychogryphosis: Secondary | ICD-10-CM | POA: Diagnosis not present

## 2016-09-01 ENCOUNTER — Encounter (HOSPITAL_COMMUNITY): Payer: Self-pay | Admitting: *Deleted

## 2016-09-01 ENCOUNTER — Inpatient Hospital Stay (HOSPITAL_COMMUNITY)
Admission: EM | Admit: 2016-09-01 | Discharge: 2016-09-04 | DRG: 683 | Disposition: A | Discharge status: Home / Self Care | Payer: Medicare Other | Attending: Family Medicine | Admitting: Family Medicine

## 2016-09-01 ENCOUNTER — Emergency Department (HOSPITAL_COMMUNITY): Payer: Medicare Other

## 2016-09-01 DIAGNOSIS — N179 Acute kidney failure, unspecified: Secondary | ICD-10-CM | POA: Diagnosis present

## 2016-09-01 DIAGNOSIS — F0390 Unspecified dementia without behavioral disturbance: Secondary | ICD-10-CM | POA: Diagnosis present

## 2016-09-01 DIAGNOSIS — N183 Chronic kidney disease, stage 3 (moderate): Secondary | ICD-10-CM | POA: Diagnosis not present

## 2016-09-01 DIAGNOSIS — E785 Hyperlipidemia, unspecified: Secondary | ICD-10-CM | POA: Diagnosis not present

## 2016-09-01 DIAGNOSIS — E870 Hyperosmolality and hypernatremia: Secondary | ICD-10-CM | POA: Diagnosis present

## 2016-09-01 DIAGNOSIS — E860 Dehydration: Secondary | ICD-10-CM | POA: Diagnosis not present

## 2016-09-01 DIAGNOSIS — N390 Urinary tract infection, site not specified: Secondary | ICD-10-CM | POA: Diagnosis not present

## 2016-09-01 DIAGNOSIS — N184 Chronic kidney disease, stage 4 (severe): Secondary | ICD-10-CM | POA: Diagnosis not present

## 2016-09-01 DIAGNOSIS — R404 Transient alteration of awareness: Secondary | ICD-10-CM | POA: Diagnosis not present

## 2016-09-01 DIAGNOSIS — N170 Acute kidney failure with tubular necrosis: Secondary | ICD-10-CM | POA: Diagnosis not present

## 2016-09-01 DIAGNOSIS — R279 Unspecified lack of coordination: Secondary | ICD-10-CM | POA: Diagnosis not present

## 2016-09-01 DIAGNOSIS — K219 Gastro-esophageal reflux disease without esophagitis: Secondary | ICD-10-CM | POA: Diagnosis not present

## 2016-09-01 DIAGNOSIS — R531 Weakness: Secondary | ICD-10-CM | POA: Diagnosis not present

## 2016-09-01 DIAGNOSIS — R918 Other nonspecific abnormal finding of lung field: Secondary | ICD-10-CM | POA: Diagnosis not present

## 2016-09-01 DIAGNOSIS — Z7401 Bed confinement status: Secondary | ICD-10-CM | POA: Diagnosis not present

## 2016-09-01 LAB — TROPONIN I: Troponin I: 0.03 ng/mL (ref <0.03)

## 2016-09-01 LAB — URINALYSIS, ROUTINE W REFLEX MICROSCOPIC
Bilirubin Urine: NEGATIVE
GLUCOSE, UA: NEGATIVE mg/dL
Ketones, ur: NEGATIVE mg/dL
NITRITE: POSITIVE — AB
PH: 5 (ref 5.0–8.0)
Protein, ur: NEGATIVE mg/dL
SPECIFIC GRAVITY, URINE: 1.013 (ref 1.005–1.030)

## 2016-09-01 LAB — COMPREHENSIVE METABOLIC PANEL
ALT: 12 U/L — AB (ref 14–54)
AST: 25 U/L (ref 15–41)
Albumin: 4 g/dL (ref 3.5–5.0)
Alkaline Phosphatase: 117 U/L (ref 38–126)
Anion gap: 9 (ref 5–15)
BILIRUBIN TOTAL: 0.7 mg/dL (ref 0.3–1.2)
BUN: 47 mg/dL — AB (ref 6–20)
CALCIUM: 10.7 mg/dL — AB (ref 8.9–10.3)
CO2: 24 mmol/L (ref 22–32)
CREATININE: 2.22 mg/dL — AB (ref 0.44–1.00)
Chloride: 129 mmol/L — ABNORMAL HIGH (ref 101–111)
GFR, EST AFRICAN AMERICAN: 22 mL/min — AB (ref >60)
GFR, EST NON AFRICAN AMERICAN: 19 mL/min — AB (ref >60)
Glucose, Bld: 162 mg/dL — ABNORMAL HIGH (ref 65–99)
Potassium: 4.4 mmol/L (ref 3.5–5.1)
Sodium: 162 mmol/L (ref 135–145)
TOTAL PROTEIN: 8.2 g/dL — AB (ref 6.5–8.1)

## 2016-09-01 LAB — CBC WITH DIFFERENTIAL/PLATELET
Basophils Absolute: 0 10*3/uL (ref 0.0–0.1)
Basophils Relative: 0 %
Eosinophils Absolute: 0.2 10*3/uL (ref 0.0–0.7)
Eosinophils Relative: 3 %
HEMATOCRIT: 48.6 % — AB (ref 36.0–46.0)
Hemoglobin: 15.8 g/dL — ABNORMAL HIGH (ref 12.0–15.0)
LYMPHS ABS: 1.2 10*3/uL (ref 0.7–4.0)
Lymphocytes Relative: 16 %
MCH: 32.4 pg (ref 26.0–34.0)
MCHC: 32.5 g/dL (ref 30.0–36.0)
MCV: 99.8 fL (ref 78.0–100.0)
MONO ABS: 0.5 10*3/uL (ref 0.1–1.0)
Monocytes Relative: 7 %
NEUTROS ABS: 5.5 10*3/uL (ref 1.7–7.7)
Neutrophils Relative %: 74 %
PLATELETS: 136 10*3/uL — AB (ref 150–400)
RBC: 4.87 MIL/uL (ref 3.87–5.11)
RDW: 15.2 % (ref 11.5–15.5)
WBC: 7.5 10*3/uL (ref 4.0–10.5)

## 2016-09-01 MED ORDER — SODIUM CHLORIDE 0.9 % IV BOLUS (SEPSIS)
1000.0000 mL | Freq: Once | INTRAVENOUS | Status: AC
  Administered 2016-09-01: 1000 mL via INTRAVENOUS

## 2016-09-01 MED ORDER — DEXTROSE 5 % IV SOLN
1.0000 g | Freq: Once | INTRAVENOUS | Status: AC
  Administered 2016-09-01: 1 g via INTRAVENOUS
  Filled 2016-09-01: qty 10

## 2016-09-01 MED ORDER — SODIUM CHLORIDE 0.9 % IV BOLUS (SEPSIS): 1000.0000 mL | Freq: Once | INTRAVENOUS | Status: DC

## 2016-09-01 NOTE — ED Provider Notes (Addendum)
[name] Provider Note   CSN: [id number] Arrival date & time: 09/01/16  2052 By signing my name below, I, Georgette Shell, attest that this documentation has been prepared under the direction and in the presence of [facility], MD. Electronically Signed: Georgette Shell, ED Scribe. 09/01/16. 9:10 PM.  History   Chief Complaint Chief Complaint  Patient presents with  . Weakness   LEVEL V CAVEAT: HPI and ROS limited due to dementia.  HPI The history is provided by the EMS personnel and the nursing home. No language interpreter was used.  Weakness  Primary symptoms include no focal weakness, no dizziness. This is a new problem. The current episode started 12 to 24 hours ago. The problem has not changed since onset.There was no focality noted. There has been no fever.   HPI Comments: Kylie Clark is a 83 y.o. female with h/o dementia, HLD, and GERD, who presents to the Emergency Department by EMS from [facility] complaining of increased, generalized weakness beginning earlier today with associated decreased appetite. Staff at facility told EMS that pt has been more weak than usual; they state she requests to sit down when they get pt up to go to the bathroom. Pt is nonverbal at baseline. No known fevers. Pt NAD.  Past Medical History:  Diagnosis Date  . Dementia   . GERD (gastroesophageal reflux disease)   . Hyperlipidemia     Patient Active Problem List   Diagnosis Date Noted  . Dementia     Past Surgical History:  Procedure Laterality Date  . ABDOMINAL HYSTERECTOMY    . CHOLECYSTECTOMY      OB History    No data available       Home Medications    Prior to Admission medications   Medication Sig Start Date End Date Taking? Authorizing Provider  acetaminophen (TYLENOL) 500 MG tablet Take 1,000 mg by mouth every 6 (six) hours as needed for mild pain or moderate pain.    Historical Provider, MD  alum & mag hydroxide-simeth (MAALOX/MYLANTA) 200-200-20 MG/5ML  suspension Take 15 mLs by mouth every 6 (six) hours as needed for indigestion or heartburn.    Historical Provider, MD  aspirin EC 81 MG tablet Take 81 mg by mouth daily.    Historical Provider, MD  docusate sodium (COLACE) 100 MG capsule Take 100 mg by mouth daily.    Historical Provider, MD  donepezil (ARICEPT) 10 MG tablet Take one tablet by mouth every night Patient taking differently: Take 10 mg by mouth at bedtime. Take one tablet by mouth every night 03/12/13   Erby Pian, FNP  furosemide (LASIX) 40 MG tablet Take 40 mg by mouth every morning.    Historical Provider, MD  guaifenesin (ROBITUSSIN) 100 MG/5ML syrup Take 200 mg by mouth 3 (three) times daily as needed for cough.    Historical Provider, MD  mirtazapine (REMERON) 15 MG tablet Take 15 mg by mouth at bedtime.    Historical Provider, MD  polyethylene glycol powder (GLYCOLAX/MIRALAX) powder Take 17 g by mouth at bedtime.    Historical Provider, MD  potassium chloride SA (K-DUR,KLOR-CON) 20 MEQ tablet Take 20 mEq by mouth daily.    Historical Provider, MD  ranitidine (ACID REDUCER) 75 MG tablet Take 75 mg by mouth 2 (two) times daily.    Historical Provider, MD  vitamin B-12 (CYANOCOBALAMIN) 1000 MCG tablet Take 1,000 mcg by mouth daily.    Historical Provider, MD    Family History History reviewed. No pertinent family  history.  Social History Social History  Substance Use Topics  . Smoking status: Never Smoker  . Smokeless tobacco: Never Used  . Alcohol use No     Allergies   Patient has no known allergies.   Review of Systems Review of Systems  Unable to perform ROS: Dementia  Neurological: Positive for weakness. Negative for dizziness and focal weakness.     Physical Exam Updated Vital Signs BP 103/74   Pulse (!) 101   Temp 98.3 F (36.8 C) (Axillary)   Resp 16   Ht 5\' 10"  (1.778 m)   Wt 200 lb (90.7 kg)   SpO2 99%   BMI 28.70 kg/m   Physical Exam  Constitutional: She appears well-developed.    HENT:  Head: Normocephalic.  Eyes: Conjunctivae and EOM are normal. No scleral icterus.  Neck: Neck supple. No thyromegaly present.  Cardiovascular: Normal rate and regular rhythm.  Exam reveals no gallop and no friction rub.   No murmur heard. Pulmonary/Chest: No stridor. She has no wheezes. She has no rales. She exhibits no tenderness.  Abdominal: She exhibits no distension. There is no tenderness. There is no rebound.  Musculoskeletal: Normal range of motion. She exhibits no edema.  Lymphadenopathy:    She has no cervical adenopathy.  Neurological:  Pt is awake, seems alert, but will not answer any questions. Pt will move all extremities with painful stimuli.  Skin: No rash noted. No erythema.     ED Treatments / Results  DIAGNOSTIC STUDIES: Oxygen Saturation is 99% on RA, normal by my interpretation.   COORDINATION OF CARE: 9:10 PM-Discussed next steps with pt. Pt verbalized understanding and is agreeable with the plan.   Labs (all labs ordered are listed, but only abnormal results are displayed) Labs Reviewed - No data to display  EKG  EKG Interpretation None       Radiology No results found.  Procedures Procedures (including critical care time)  Medications Ordered in ED Medications - No data to display   Initial Impression / Assessment and Plan / ED Course  I have reviewed the triage vital signs and the nursing notes.  Pertinent labs & imaging results that were available during my care of the patient were reviewed by me and considered in my medical decision making (see chart for details).     Patient with hypernatremia dehydration urinary tract infection and dementia. She will be admitted for IV fluids and treatment of urinary tract infection  Final Clinical Impressions(s) / ED Diagnoses   Final diagnoses:  None    New Prescriptions New Prescriptions   No medications on file   The chart was scribed for me under my direct supervision.  I  personally performed the history, physical, and medical decision making and all procedures in the evaluation of this patient.M[facility], MD 09/01/16 0932    M[facility], MD 09/01/16 2255

## 2016-09-01 NOTE — ED Triage Notes (Signed)
Pt brought in by [facility] for c/o generalized weakness; staff at [facility] told ems pt has been more weak than normal by wanting to sit down when they get her up to take to bathroom and pt has had a decrease in appetite; cbg 285; pt is usually nonverbal

## 2016-09-01 NOTE — ED Notes (Signed)
CRITICAL VALUE ALERT  Critical value received:  Na 162, Trop 0.03  Date of notification:  09/01/16  Time of notification:  2222  Critical value read back:Yes.    Nurse who received alert:  B. Olena Heckle, RN  MD notified (1st page):  [place]  Time of first page:  2222

## 2016-09-02 DIAGNOSIS — N390 Urinary tract infection, site not specified: Secondary | ICD-10-CM

## 2016-09-02 DIAGNOSIS — E860 Dehydration: Secondary | ICD-10-CM

## 2016-09-02 DIAGNOSIS — E870 Hyperosmolality and hypernatremia: Secondary | ICD-10-CM

## 2016-09-02 DIAGNOSIS — R531 Weakness: Secondary | ICD-10-CM

## 2016-09-02 DIAGNOSIS — N179 Acute kidney failure, unspecified: Secondary | ICD-10-CM

## 2016-09-02 LAB — BASIC METABOLIC PANEL
Anion gap: 5 (ref 5–15)
BUN: 43 mg/dL — AB (ref 6–20)
BUN: 38 mg/dL — AB (ref 6–20)
CHLORIDE: 130 mmol/L — AB (ref 101–111)
CO2: 22 mmol/L (ref 22–32)
CO2: 25 mmol/L (ref 22–32)
CREATININE: 1.97 mg/dL — AB (ref 0.44–1.00)
Calcium: 9 mg/dL (ref 8.9–10.3)
Calcium: 9.3 mg/dL (ref 8.9–10.3)
Creatinine, Ser: 1.79 mg/dL — ABNORMAL HIGH (ref 0.44–1.00)
GFR calc Af Amer: 26 mL/min — ABNORMAL LOW (ref >60)
GFR calc Af Amer: 29 mL/min — ABNORMAL LOW (ref >60)
GFR calc non Af Amer: 22 mL/min — ABNORMAL LOW (ref >60)
GFR calc non Af Amer: 25 mL/min — ABNORMAL LOW (ref >60)
GLUCOSE: 114 mg/dL — AB (ref 65–99)
Glucose, Bld: 118 mg/dL — ABNORMAL HIGH (ref 65–99)
POTASSIUM: 3.7 mmol/L (ref 3.5–5.1)
Potassium: 4.2 mmol/L (ref 3.5–5.1)
SODIUM: 160 mmol/L — AB (ref 135–145)
Sodium: 162 mmol/L (ref 135–145)

## 2016-09-02 LAB — CBC
HCT: 41.9 % (ref 36.0–46.0)
Hemoglobin: 13.4 g/dL (ref 12.0–15.0)
MCH: 31.9 pg (ref 26.0–34.0)
MCHC: 32 g/dL (ref 30.0–36.0)
MCV: 99.8 fL (ref 78.0–100.0)
PLATELETS: 106 10*3/uL — AB (ref 150–400)
RBC: 4.2 MIL/uL (ref 3.87–5.11)
RDW: 15.4 % (ref 11.5–15.5)
WBC: 6.5 10*3/uL (ref 4.0–10.5)

## 2016-09-02 LAB — MRSA PCR SCREENING: MRSA by PCR: NEGATIVE

## 2016-09-02 LAB — OSMOLALITY: Osmolality: 366 mOsm/kg (ref 275–295)

## 2016-09-02 MED ORDER — DEXTROSE 5 % IV SOLN
INTRAVENOUS | Status: DC
  Administered 2016-09-02: 12:00:00 via INTRAVENOUS

## 2016-09-02 MED ORDER — MIRTAZAPINE 15 MG PO TBDP
15.0000 mg | ORAL_TABLET | Freq: Every day | ORAL | Status: DC
  Administered 2016-09-02: 15 mg via ORAL
  Filled 2016-09-02 (×4): qty 1

## 2016-09-02 MED ORDER — ENOXAPARIN SODIUM 30 MG/0.3ML ~~LOC~~ SOLN
30.0000 mg | SUBCUTANEOUS | Status: DC
  Administered 2016-09-02 – 2016-09-04 (×3): 30 mg via SUBCUTANEOUS
  Filled 2016-09-02 (×3): qty 0.3

## 2016-09-02 MED ORDER — SODIUM CHLORIDE 0.9 % IV SOLN
INTRAVENOUS | Status: DC
  Administered 2016-09-02: 01:00:00 via INTRAVENOUS

## 2016-09-02 MED ORDER — ENOXAPARIN SODIUM 40 MG/0.4ML ~~LOC~~ SOLN: 40.0000 mg | SUBCUTANEOUS | Status: DC

## 2016-09-02 MED ORDER — DEXTROSE 5 % IV SOLN
1.0000 g | INTRAVENOUS | Status: DC
  Administered 2016-09-02 – 2016-09-04 (×3): 1 g via INTRAVENOUS
  Filled 2016-09-02 (×3): qty 10

## 2016-09-02 MED ORDER — ENSURE ENLIVE PO LIQD
237.0000 mL | Freq: Two times a day (BID) | ORAL | Status: DC
  Administered 2016-09-02 – 2016-09-04 (×5): 237 mL via ORAL

## 2016-09-02 MED ORDER — SODIUM CHLORIDE 0.45 % IV SOLN
INTRAVENOUS | Status: DC
  Administered 2016-09-02: 06:00:00 via INTRAVENOUS

## 2016-09-02 MED ORDER — SODIUM CHLORIDE 0.9 % IV BOLUS (SEPSIS)
800.0000 mL | Freq: Once | INTRAVENOUS | Status: AC
  Administered 2016-09-02: 800 mL via INTRAVENOUS

## 2016-09-02 NOTE — Progress Notes (Signed)
Initial Nutrition Assessment  DOCUMENTATION CODES:   Not applicable  INTERVENTION:  Ensure Enlive po BID, each supplement provides 350 kcal and 20 grams of protein   Magic cup TID with meals, each supplement provides 290 kcal and 9 grams of protein   Nursing staff will assist with feeding if family not present   NUTRITION DIAGNOSIS:   Inadequate oral intake related to acute illness as evidenced by limited food and beverage intake-  meal completion < 25% .   GOAL:  Pt to meet >/= 90% of their estimated nutrition needs based on pt current full code status.    MONITOR:   PO intake, Supplement acceptance, Labs, Weight trends and progression of care goals  REASON FOR ASSESSMENT:   Low Braden    ASSESSMENT:  Kylie Clark is an 83 yo female from [place]. She has hx of Dementia, GERD and HLD. Kylie Clark presents to ED with complaint of increased weakness and was found to be hypernatremic. At baseline she is able to walk with standby assistance.  Her lunch tray is still here and only a couple of bites were eaten. No liquids consumed. A family member is here and had attempted to feed her. According the them she usually feeds herself. She likes ice cream and usually drinks Ensure. Her diet is Pureed textures and thin liquids.  Review of her wt hx shows very stable results: range of 77-78 kg the past 3 years. Nutrition-Focused physical exam completed upper body. Findings are no fat depletion, mild temporal muscle depletion, and no edema.    Recent Labs Lab 09/01/16 2123 09/02/16 0358  NA 162* 162*  K 4.4 4.2  CL 129* >130*  CO2 24 22  BUN 47* 43*  CREATININE 2.22* 1.97*  CALCIUM 10.7* 9.0  GLUCOSE 162* 114*   Labs: hypernatremia, hypochloridemia, Cr and Glucose trending down  Meds: reviewed  Diet Order:  DIET - DYS 1 Room service appropriate? Yes; Fluid consistency: Thin  Skin:   intact   Last BM:  unknown  Height:   Ht Readings from Last 1 Encounters:  09/01/16 5'  10" (1.778 m)    Weight:   Wt Readings from Last 1 Encounters:  09/01/16 172 lb 13.5 oz (78.4 kg)    Ideal Body Weight:  68 kg  BMI:  Body mass index is 24.8 kg/m.  Estimated Nutritional Needs:   Kcal:  9702-6378  Protein:  90-95 gr  Fluid:  1.5-1.6 liters daily  EDUCATION NEEDS:   No education needs identified at this time   Colman Cater Kylie,RD,CSG,LDN Office: #[phone] Pager: [phone]

## 2016-09-02 NOTE — H&P (Signed)
History and Physical    Kylie Clark YKZ:[id number] DOB: June 01, 1932 DOA: 09/01/2016  PCP: No PCP Per Patient  Patient coming from: SNF  Chief Complaint:   weakness  HPI: Kylie Clark is a 83 y.o. female with medical history significant of dementia who normally walks with assistance at her SNF , normally nonverbal sent in for weakness and unable to ambulate like she normally does without sitting prematurely.  No report of n/v/d.  No report of fevers.  Pt found to have uti and high Na level and referred for treatment of such.  Pt cannot provide hisotory.  All info obtained from chart and ed staff.  Review of Systems: unobtainable due to dementia  Past Medical History:  Diagnosis Date  . Dementia   . GERD (gastroesophageal reflux disease)   . Hyperlipidemia     Past Surgical History:  Procedure Laterality Date  . ABDOMINAL HYSTERECTOMY    . CHOLECYSTECTOMY       reports that she has never smoked. She has never used smokeless tobacco. She reports that she does not drink alcohol or use drugs.  No Known Allergies  History reviewed. No pertinent family history. unobtainable  Prior to Admission medications   Medication Sig Start Date End Date Taking? Authorizing Provider  acetaminophen (TYLENOL) 500 MG tablet Take 1,000 mg by mouth every 6 (six) hours as needed for mild pain or moderate pain.    Historical Provider, MD  alum & mag hydroxide-simeth (MAALOX/MYLANTA) 200-200-20 MG/5ML suspension Take 15 mLs by mouth every 6 (six) hours as needed for indigestion or heartburn.    Historical Provider, MD  aspirin EC 81 MG tablet Take 81 mg by mouth daily.    Historical Provider, MD  docusate sodium (COLACE) 100 MG capsule Take 100 mg by mouth daily.    Historical Provider, MD  donepezil (ARICEPT) 10 MG tablet Take one tablet by mouth every night Patient taking differently: Take 10 mg by mouth at bedtime. Take one tablet by mouth every night 03/12/13   Erby Pian, FNP    furosemide (LASIX) 40 MG tablet Take 40 mg by mouth every morning.    Historical Provider, MD  guaifenesin (ROBITUSSIN) 100 MG/5ML syrup Take 200 mg by mouth 3 (three) times daily as needed for cough.    Historical Provider, MD  mirtazapine (REMERON) 15 MG tablet Take 15 mg by mouth at bedtime.    Historical Provider, MD  polyethylene glycol powder (GLYCOLAX/MIRALAX) powder Take 17 g by mouth at bedtime.    Historical Provider, MD  potassium chloride SA (K-DUR,KLOR-CON) 20 MEQ tablet Take 20 mEq by mouth daily.    Historical Provider, MD  ranitidine (ACID REDUCER) 75 MG tablet Take 75 mg by mouth 2 (two) times daily.    Historical Provider, MD  vitamin B-12 (CYANOCOBALAMIN) 1000 MCG tablet Take 1,000 mcg by mouth daily.    Historical Provider, MD    Physical Exam: Vitals:   09/01/16 2055 09/01/16 2056 09/01/16 2300 09/01/16 2358  BP:  103/74 101/86 138/73  Pulse:  (!) 101  86  Resp:  16  18  Temp:  98.3 F (36.8 C)  97.6 F (36.4 C)  TempSrc:  Axillary  Oral  SpO2:  99%  93%  Weight: 90.7 kg (200 lb)   78.4 kg (172 lb 13.5 oz)  Height: 5\' 10"  (1.778 m)   5\' 10"  (1.778 m)    Constitutional: NAD, calm, comfortable Vitals:   09/01/16 2055 09/01/16 2056 09/01/16 2300 09/01/16 2358  BP:  103/74 101/86 138/73  Pulse:  (!) 101  86  Resp:  16  18  Temp:  98.3 F (36.8 C)  97.6 F (36.4 C)  TempSrc:  Axillary  Oral  SpO2:  99%  93%  Weight: 90.7 kg (200 lb)   78.4 kg (172 lb 13.5 oz)  Height: 5\' 10"  (1.778 m)   5\' 10"  (1.778 m)   Eyes: PERRL, lids and conjunctivae normal ENMT: Mucous membranes are moist. Posterior pharynx clear of any exudate or lesions.Normal dentition.  Neck: normal, supple, no masses, no thyromegaly Respiratory: clear to auscultation bilaterally, no wheezing, no crackles. Normal respiratory effort. No accessory muscle use.  Cardiovascular: Regular rate and rhythm, no murmurs / rubs / gallops. No extremity edema. 2+ pedal pulses. No carotid bruits.  Abdomen: no  tenderness, no masses palpated. No hepatosplenomegaly. Bowel sounds positive.  Musculoskeletal: no clubbing / cyanosis. No joint deformity upper and lower extremities. Good ROM, no contractures. Normal muscle tone.  Skin: no rashes, lesions, ulcers. No induration Neurologic: CN 2-12 grossly intact. Sensation intact, DTR normal. MAE spontaneously Psychiatric: demented, nonverbal does not follow commands   Labs on Admission: I have personally reviewed following labs and imaging studies  CBC:  Recent Labs Lab 09/01/16 2123  WBC 7.5  NEUTROABS 5.5  HGB 15.8*  HCT 48.6*  MCV 99.8  PLT 161*   Basic Metabolic Panel:  Recent Labs Lab 09/01/16 2123  NA 162*  K 4.4  CL 129*  CO2 24  GLUCOSE 162*  BUN 47*  CREATININE 2.22*  CALCIUM 10.7*   GFR: Estimated Creatinine Clearance: 20.8 mL/min (A) (by C-G formula based on SCr of 2.22 mg/dL (H)). Liver Function Tests:  Recent Labs Lab 09/01/16 2123  AST 25  ALT 12*  ALKPHOS 117  BILITOT 0.7  PROT 8.2*  ALBUMIN 4.0   Cardiac Enzymes:  Recent Labs Lab 09/01/16 2123  TROPONINI 0.03*   Urine analysis:    Component Value Date/Time   COLORURINE YELLOW 09/01/2016 2117   APPEARANCEUR CLOUDY (A) 09/01/2016 2117   LABSPEC 1.013 09/01/2016 2117   PHURINE 5.0 09/01/2016 2117   GLUCOSEU NEGATIVE 09/01/2016 2117   HGBUR MODERATE (A) 09/01/2016 2117   BILIRUBINUR NEGATIVE 09/01/2016 2117   KETONESUR NEGATIVE 09/01/2016 2117   PROTEINUR NEGATIVE 09/01/2016 2117   NITRITE POSITIVE (A) 09/01/2016 2117   LEUKOCYTESUR MODERATE (A) 09/01/2016 2117    Radiological Exams on Admission: Dg Chest Portable 1 View  Result Date: 09/01/2016 CLINICAL DATA:  Initial evaluation for acute generalized weakness. EXAM: PORTABLE CHEST 1 VIEW COMPARISON:  None available. FINDINGS: Cardiac and mediastinal silhouettes are partially obscured, but grossly within normal limits. Few calcified mediastinal lymph nodes noted on the left. Lungs are  hypoinflated. Mild bibasilar and perihilar vascular congestion, likely related to shallow lung inflation. No frank pulmonary edema. No pleural effusion. No focal infiltrates. No pneumothorax. No acute osseus abnormality. IMPRESSION: 1. Shallow lung inflation with secondary mild bibasilar bronchovascular crowding. 2. No other active cardiopulmonary disease. Electronically Signed   By: Jeannine Boga M.D.   On: 09/01/2016 21:58    Assessment/Plan 83 yo demented female sent in from SNF for generalized weakness found to be dehydrated, hypernatremic with uti  Principal Problem:   Hypernatremia- ivf, getting second liter of ivf.  Ordered bmp for 1am and this has not been done yet, have called to have this done STAT now.  Likely due to dehydration.  Active Problems:   Dementia- stable    UTI (urinary tract infection)-  place on rocephin.  cx pending   Weakness generalized- likely due to all listed issues   AKI (acute kidney injury) ([place])- prerenal, ivf   Dehydration- ivf    DVT prophylaxis:  lovenox  Code Status:   Presumptive full code Family Communication:  none Disposition Plan:  Per day team Consults called:   none Admission status:   admission   Kylie Clark A MD [facility]  If 7PM-7AM, please contact night-coverage [email or web address] Password [facility]  09/02/2016, 12:24 AM

## 2016-09-02 NOTE — Clinical Social Work Note (Signed)
Clinical Social Work Assessment  Patient Details  Name: Kylie Clark MRN: [id number] Date of Birth: 22-Sep-1931  Date of referral:  09/02/16               Reason for consult:  Discharge Planning                Permission sought to share information with:    Permission granted to share information::     Name::        Agency::     Relationship::     Contact Information:  Antonietta Breach, dtr, listed on chart.   Housing/Transportation Living arrangements for the past 2 months:  [place] of Information:  Adult Children Patient Interpreter Needed:  None Criminal Activity/Legal Involvement Pertinent to Current Situation/Hospitalization:  No - Comment as needed Significant Relationships:  Adult Children Lives with:  Facility Resident Do you feel safe going back to the place where you live?  Yes Need for family participation in patient care:  Yes (Comment)  Care giving concerns: Facility resident.   Social Worker assessment / plan: LCSW contacted [facility] multiple times to no avail. LCSW spoke with patient's daughter, [name]. Patient has been a resident at [facility] for three years, she ambulates unassisted and requires full assistance.  Ms. Jimmye Norman indicated that they desired for patient to return to the facility at discharge.   Employment status:  Retired Nurse, adult PT Recommendations:  Not assessed at this time Information / Referral to community resources:     Patient/Family's Response to care:  Patient's family desires return to [facility].   Patient/Family's Understanding of and Emotional Response to Diagnosis, Current Treatment, and Prognosis: Family understands diagnosis, current treatment and prognosis.   Emotional Assessment Appearance:  Appears stated age Attitude/Demeanor/Rapport:  Unable to Assess (Patient is non verbal and looks away when spoken to) Affect (typically observed):  Unable to Assess  (Patient is non verbal. Looks away when spoken to.) Orientation:   (Patient is non verbal) Alcohol / Substance use:  Not Applicable Psych involvement (Current and /or in the community):  No (Comment)  Discharge Needs  Concerns to be addressed:  Discharge Planning Concerns Readmission within the last 30 days:  No Current discharge risk:  None Barriers to Discharge:  No Barriers Identified   Ihor Gully, LCSW 09/02/2016, 10:34 AM

## 2016-09-02 NOTE — Consult Note (Signed)
Reason for Consult: Hypernatremia Referring Physician: Dr.Kadolph  Kylie Clark is an 83 y.o. female.  HPI: She is a patient who has history of dementia, GERD presently came from nursing home because of weakness and unable to walk. Presently patient is alert but does not talk and answer questions. At this moment no additional information is available from patient. The information is gathered from her chart. When she was evaluated dementia syndrome patient was found to have hypernatremia hence admitted to the hospital.  Past Medical History:  Diagnosis Date  . Dementia   . GERD (gastroesophageal reflux disease)   . Hyperlipidemia     Past Surgical History:  Procedure Laterality Date  . ABDOMINAL HYSTERECTOMY    . CHOLECYSTECTOMY      History reviewed. No pertinent family history.  Social History:  reports that she has never smoked. She has never used smokeless tobacco. She reports that she does not drink alcohol or use drugs.  Allergies: No Known Allergies  Medications: I have reviewed the patient's current medications.  Results for orders placed or performed during the hospital encounter of 09/01/16 (from the past 48 hour(s))  Urinalysis, Routine w reflex microscopic     Status: Abnormal   Collection Time: 09/01/16  9:17 PM  Result Value Ref Range   Color, Urine YELLOW YELLOW   APPearance CLOUDY (A) CLEAR   Specific Gravity, Urine 1.013 1.005 - 1.030   pH 5.0 5.0 - 8.0   Glucose, UA NEGATIVE NEGATIVE mg/dL   Hgb urine dipstick MODERATE (A) NEGATIVE   Bilirubin Urine NEGATIVE NEGATIVE   Ketones, ur NEGATIVE NEGATIVE mg/dL   Protein, ur NEGATIVE NEGATIVE mg/dL   Nitrite POSITIVE (A) NEGATIVE   Leukocytes, UA MODERATE (A) NEGATIVE   RBC / HPF 0-5 0 - 5 RBC/hpf   WBC, UA 6-30 0 - 5 WBC/hpf   Bacteria, UA MANY (A) NONE SEEN   Squamous Epithelial / LPF 0-5 (A) NONE SEEN   Mucous PRESENT    Hyaline Casts, UA PRESENT   CBC with Differential/Platelet     Status: Abnormal    Collection Time: 09/01/16  9:23 PM  Result Value Ref Range   WBC 7.5 4.0 - 10.5 K/uL   RBC 4.87 3.87 - 5.11 MIL/uL   Hemoglobin 15.8 (H) 12.0 - 15.0 g/dL   HCT 48.6 (H) 36.0 - 46.0 %   MCV 99.8 78.0 - 100.0 fL   MCH 32.4 26.0 - 34.0 pg   MCHC 32.5 30.0 - 36.0 g/dL   RDW 15.2 11.5 - 15.5 %   Platelets 136 (L) 150 - 400 K/uL   Neutrophils Relative % 74 %   Neutro Abs 5.5 1.7 - 7.7 K/uL   Lymphocytes Relative 16 %   Lymphs Abs 1.2 0.7 - 4.0 K/uL   Monocytes Relative 7 %   Monocytes Absolute 0.5 0.1 - 1.0 K/uL   Eosinophils Relative 3 %   Eosinophils Absolute 0.2 0.0 - 0.7 K/uL   Basophils Relative 0 %   Basophils Absolute 0.0 0.0 - 0.1 K/uL  Comprehensive metabolic panel     Status: Abnormal   Collection Time: 09/01/16  9:23 PM  Result Value Ref Range   Sodium 162 (HH) 135 - 145 mmol/L    Comment: CRITICAL RESULT CALLED TO, READ BACK BY AND VERIFIED WITH:  DANIELS,B @ 2222 ON 09/01/16 BY JUW    Potassium 4.4 3.5 - 5.1 mmol/L   Chloride 129 (H) 101 - 111 mmol/L   CO2 24 22 - 32  mmol/L   Glucose, Bld 162 (H) 65 - 99 mg/dL   BUN 47 (H) 6 - 20 mg/dL   Creatinine, Ser 2.22 (H) 0.44 - 1.00 mg/dL   Calcium 10.7 (H) 8.9 - 10.3 mg/dL   Total Protein 8.2 (H) 6.5 - 8.1 g/dL   Albumin 4.0 3.5 - 5.0 g/dL   AST 25 15 - 41 U/L   ALT 12 (L) 14 - 54 U/L   Alkaline Phosphatase 117 38 - 126 U/L   Total Bilirubin 0.7 0.3 - 1.2 mg/dL   GFR calc non Af Amer 19 (L) >60 mL/min   GFR calc Af Amer 22 (L) >60 mL/min    Comment: (NOTE) The eGFR has been calculated using the CKD EPI equation. This calculation has not been validated in all clinical situations. eGFR's persistently <60 mL/min signify possible Chronic Kidney Disease.    Anion gap 9 5 - 15  Troponin I     Status: Abnormal   Collection Time: 09/01/16  9:23 PM  Result Value Ref Range   Troponin I 0.03 (HH) <0.03 ng/mL    Comment: CRITICAL RESULT CALLED TO, READ BACK BY AND VERIFIED WITH:  DANIELS,B @ 2222 ON 09/01/16 BY JUW   MRSA  PCR Screening     Status: None   Collection Time: 09/02/16 12:31 AM  Result Value Ref Range   MRSA by PCR NEGATIVE NEGATIVE    Comment:        The GeneXpert MRSA Assay (FDA approved for NASAL specimens only), is one component of a comprehensive MRSA colonization surveillance program. It is not intended to diagnose MRSA infection nor to guide or monitor treatment for MRSA infections.   Basic metabolic panel     Status: Abnormal   Collection Time: 09/02/16  3:58 AM  Result Value Ref Range   Sodium 162 (HH) 135 - 145 mmol/L    Comment: CRITICAL RESULT CALLED TO, READ BACK BY AND VERIFIED WITH: THOMAS,C AT 4:40AM ON 09/02/16 BY FESTERMAN,C    Potassium 4.2 3.5 - 5.1 mmol/L   Chloride >130 (HH) 101 - 111 mmol/L    Comment: CRITICAL RESULT CALLED TO, READ BACK BY AND VERIFIED WITH: THOMAS,C AT 4:40AM ON 09/02/16 BY FESTERMAN,C    CO2 22 22 - 32 mmol/L   Glucose, Bld 114 (H) 65 - 99 mg/dL   BUN 43 (H) 6 - 20 mg/dL   Creatinine, Ser 1.97 (H) 0.44 - 1.00 mg/dL   Calcium 9.0 8.9 - 10.3 mg/dL   GFR calc non Af Amer 22 (L) >60 mL/min   GFR calc Af Amer 26 (L) >60 mL/min    Comment: (NOTE) The eGFR has been calculated using the CKD EPI equation. This calculation has not been validated in all clinical situations. eGFR's persistently <60 mL/min signify possible Chronic Kidney Disease.   CBC     Status: Abnormal   Collection Time: 09/02/16  3:58 AM  Result Value Ref Range   WBC 6.5 4.0 - 10.5 K/uL   RBC 4.20 3.87 - 5.11 MIL/uL   Hemoglobin 13.4 12.0 - 15.0 g/dL   HCT 41.9 36.0 - 46.0 %   MCV 99.8 78.0 - 100.0 fL   MCH 31.9 26.0 - 34.0 pg   MCHC 32.0 30.0 - 36.0 g/dL   RDW 15.4 11.5 - 15.5 %   Platelets 106 (L) 150 - 400 K/uL    Comment: SPECIMEN CHECKED FOR CLOTS    Dg Chest Portable 1 View  Result Date: 09/01/2016 CLINICAL DATA:  Initial  evaluation for acute generalized weakness. EXAM: PORTABLE CHEST 1 VIEW COMPARISON:  None available. FINDINGS: Cardiac and mediastinal  silhouettes are partially obscured, but grossly within normal limits. Few calcified mediastinal lymph nodes noted on the left. Lungs are hypoinflated. Mild bibasilar and perihilar vascular congestion, likely related to shallow lung inflation. No frank pulmonary edema. No pleural effusion. No focal infiltrates. No pneumothorax. No acute osseus abnormality. IMPRESSION: 1. Shallow lung inflation with secondary mild bibasilar bronchovascular crowding. 2. No other active cardiopulmonary disease. Electronically Signed   By: Jeannine Boga M.D.   On: 09/01/2016 21:58    Review of Systems  Unable to perform ROS: Dementia   Blood pressure 123/69, pulse 84, temperature 97.6 F (36.4 C), temperature source Oral, resp. rate 18, height 5' 10"  (1.778 m), weight 78.4 kg (172 lb 13.5 oz), SpO2 100 %. Physical Exam  Constitutional: No distress.  Patient is alert and sitting up. At this moment she doesn't comunicate  Eyes: No scleral icterus.  Neck: No JVD present.  Cardiovascular: Normal rate and regular rhythm.   Respiratory: No respiratory distress. She has no wheezes.  GI: She exhibits no distension. There is no tenderness.  Musculoskeletal: She exhibits no edema.    Assessment/Plan: Problem #1 hypernatremia: At this moment seems to be from lack of free water as patient has history of dementia, elevated BUN and creatinine and hyperkalemia. Her sodium seems to be high and stable. Problem #2 renal failure: Possibly acute. Her BUN and creatinine is improving. Most likely prerenal syndrome. Problem #3 hypercalcemia: Presently her calcium has improved Problem #5 history of UTI Problem #5 history of GERD Problem #6 history of dementia seems to be severe. Plan: Change IV fluid to D5 water at 125cc per hour We'll check her renal panel in a.m. We'll check urine sodium and osmolality  Layliana Devins S 09/02/2016, 9:06 AM

## 2016-09-02 NOTE — Progress Notes (Signed)
Pt is nonverbal and has severe dementia. Pt unable answer admission questions. Her nephew Tempie Donning) tried to answer most. He told me that her son will be here in the am and will help with the ones that were left unanswered.

## 2016-09-02 NOTE — Progress Notes (Signed)
Agree with Dr. Camelia Eng H&P.  Did get input from nephrology given stable BMP this am.  Discussed treatment plan with patient's son.  He is willing to try to feed patient.  Will repeat BMP in evening and again in am.  Appreciate nephrology recommendations.

## 2016-09-02 NOTE — Progress Notes (Signed)
Critical Sodium 162 & Chloride 133. MD made aware. New orders were placed. Will continue to monitor.   Orthostatic vitals were ordered upon admission-patient is nonverbal and cannot stand nor sit up. Orthos were not able to be taken due to patients condition.

## 2016-09-03 LAB — RENAL FUNCTION PANEL
Albumin: 2.8 g/dL — ABNORMAL LOW (ref 3.5–5.0)
Anion gap: 5 (ref 5–15)
BUN: 29 mg/dL — AB (ref 6–20)
CHLORIDE: 124 mmol/L — AB (ref 101–111)
CO2: 25 mmol/L (ref 22–32)
Calcium: 8.8 mg/dL — ABNORMAL LOW (ref 8.9–10.3)
Creatinine, Ser: 1.58 mg/dL — ABNORMAL HIGH (ref 0.44–1.00)
GFR calc Af Amer: 34 mL/min — ABNORMAL LOW (ref >60)
GFR, EST NON AFRICAN AMERICAN: 29 mL/min — AB (ref >60)
Glucose, Bld: 125 mg/dL — ABNORMAL HIGH (ref 65–99)
POTASSIUM: 3.4 mmol/L — AB (ref 3.5–5.1)
Phosphorus: 2.6 mg/dL (ref 2.5–4.6)
Sodium: 154 mmol/L — ABNORMAL HIGH (ref 135–145)

## 2016-09-03 LAB — TROPONIN I: Troponin I: <0.03 ng/mL (ref <0.03)

## 2016-09-03 MED ORDER — POTASSIUM CHLORIDE 2 MEQ/ML IV SOLN
INTRAVENOUS | Status: DC
  Administered 2016-09-03: 09:00:00 via INTRAVENOUS
  Filled 2016-09-03 (×5): qty 1000

## 2016-09-03 NOTE — Progress Notes (Signed)
This writer attempted in and out cath without success, patient became combative, MD notified, no new order. Will attempt at another time.

## 2016-09-03 NOTE — Progress Notes (Signed)
PROGRESS NOTE    Kylie Clark  BHA:[id number] DOB: 1931-08-16 DOA: 09/01/2016 PCP: No PCP Per Patient   Brief Narrative:  Kylie Clark is a 83 y.o. female with medical history significant of dementia who normally walks with assistance at her SNF , normally nonverbal sent in for weakness and unable to ambulate like she normally does without sitting prematurely.  No report of n/v/d.  No report of fevers.  Pt found to have uti and high Na level and referred for treatment of such.  Pt cannot provide hisotory.  All info obtained from chart and ed staff.    Assessment & Plan:   Principal Problem:   Hypernatremia Active Problems:   Dementia   UTI (urinary tract infection)   Weakness generalized   AKI (acute kidney injury) (HCC)   Dehydration   Hypernatremia - ivf - getting second liter of ivf - nephrology consulted - D5W with 59mEq ofKCl @75ml /hr - improving with IVF  Dementia - stable   UTI (urinary tract infection) - continue rocephin - cx showing >100k colonies of GNR - bacteria ID and sensitivities pending  Poor PO intake - started mirtazapine 15mg  disintegrating tablet  Weakness generalized - unclear how long this has been going on - PT eval  AKI (acute kidney injury) (HCC) - prerenal, ivf - Cr slightly improved  Dehydration - ivf    DVT prophylaxis:  lovenox  Code Status:   Presumptive full code Family Communication:  daughter is bedside Disposition Plan:  Per day team   Consultants:   Nephrology  Procedures:   None  Antimicrobials:   Rocephin    Subjective: Patient awake, lunch tray in front of her.  She nods her head when I started her son came to visit her yesterday.  She says yes when I asked her if she feels better.  She shakes her head no when I ask if she wants to eat anything.  Daughter is bedside  Objective: Vitals:   09/01/16 2358 09/02/16 0526 09/02/16 2232 09/03/16 0700  BP: 138/73 123/69 104/69 (!) 107/59  Pulse: 86  84 82 76  Resp: 18 18 18 18   Temp: 97.6 F (36.4 C) 97.6 F (36.4 C) 98.2 F (36.8 C) 97.6 F (36.4 C)  TempSrc: Oral Oral Axillary Axillary  SpO2: 93% 100% 100% 100%  Weight: 78.4 kg (172 lb 13.5 oz)     Height: 5\' 10"  (1.778 m)       Intake/Output Summary (Last 24 hours) at 09/03/16 0947 Last data filed at 09/03/16 0732  Gross per 24 hour  Intake           920.42 ml  Output                1 ml  Net           919.42 ml   Filed Weights   09/01/16 2055 09/01/16 2358  Weight: 90.7 kg (200 lb) 78.4 kg (172 lb 13.5 oz)    Examination:  General exam: Appears calm and comfortable  Respiratory system: Clear to auscultation. Respiratory effort normal. Cardiovascular system: S1 & S2 heard, RRR. No JVD, murmurs, rubs, gallops or clicks. No pedal edema. Gastrointestinal system: Abdomen is nondistended, soft and nontender. No organomegaly or masses felt. Normal bowel sounds heard. Central nervous system: Alert could not assess orientation due to minimal communication from patient.  Extremities: able to move arms and legs Skin: No rashes, lesions or ulcers Psychiatry:  Mood appropriate.     Data  Reviewed: I have personally reviewed following labs and imaging studies  CBC:  Recent Labs Lab 09/01/16 2123 09/02/16 0358  WBC 7.5 6.5  NEUTROABS 5.5  --   HGB 15.8* 13.4  HCT 48.6* 41.9  MCV 99.8 99.8  PLT 136* 836*   Basic Metabolic Panel:  Recent Labs Lab 09/01/16 2123 09/02/16 0358 09/02/16 1748 09/03/16 0609  NA 162* 162* 160* 154*  K 4.4 4.2 3.7 3.4*  CL 129* >130* 130* 124*  CO2 24 22 25 25   GLUCOSE 162* 114* 118* 125*  BUN 47* 43* 38* 29*  CREATININE 2.22* 1.97* 1.79* 1.58*  CALCIUM 10.7* 9.0 9.3 8.8*  PHOS  --   --   --  2.6   GFR: Estimated Creatinine Clearance: 29.2 mL/min (A) (by C-G formula based on SCr of 1.58 mg/dL (H)). Liver Function Tests:  Recent Labs Lab 09/01/16 2123 09/03/16 0609  AST 25  --   ALT 12*  --   ALKPHOS 117  --   BILITOT  0.7  --   PROT 8.2*  --   ALBUMIN 4.0 2.8*   No results for input(s): LIPASE, AMYLASE in the last 168 hours. No results for input(s): AMMONIA in the last 168 hours. Coagulation Profile: No results for input(s): INR, PROTIME in the last 168 hours. Cardiac Enzymes:  Recent Labs Lab 09/01/16 2123  TROPONINI 0.03*   BNP (last 3 results) No results for input(s): PROBNP in the last 8760 hours. HbA1C: No results for input(s): HGBA1C in the last 72 hours. CBG: No results for input(s): GLUCAP in the last 168 hours. Lipid Profile: No results for input(s): CHOL, HDL, LDLCALC, TRIG, CHOLHDL, LDLDIRECT in the last 72 hours. Thyroid Function Tests: No results for input(s): TSH, T4TOTAL, FREET4, T3FREE, THYROIDAB in the last 72 hours. Anemia Panel: No results for input(s): VITAMINB12, FOLATE, FERRITIN, TIBC, IRON, RETICCTPCT in the last 72 hours. Sepsis Labs: No results for input(s): PROCALCITON, LATICACIDVEN in the last 168 hours.  Recent Results (from the past 240 hour(s))  MRSA PCR Screening     Status: None   Collection Time: 09/02/16 12:31 AM  Result Value Ref Range Status   MRSA by PCR NEGATIVE NEGATIVE Final    Comment:        The GeneXpert MRSA Assay (FDA approved for NASAL specimens only), is one component of a comprehensive MRSA colonization surveillance program. It is not intended to diagnose MRSA infection nor to guide or monitor treatment for MRSA infections.          Radiology Studies: Dg Chest Portable 1 View  Result Date: 09/01/2016 CLINICAL DATA:  Initial evaluation for acute generalized weakness. EXAM: PORTABLE CHEST 1 VIEW COMPARISON:  None available. FINDINGS: Cardiac and mediastinal silhouettes are partially obscured, but grossly within normal limits. Few calcified mediastinal lymph nodes noted on the left. Lungs are hypoinflated. Mild bibasilar and perihilar vascular congestion, likely related to shallow lung inflation. No frank pulmonary edema. No  pleural effusion. No focal infiltrates. No pneumothorax. No acute osseus abnormality. IMPRESSION: 1. Shallow lung inflation with secondary mild bibasilar bronchovascular crowding. 2. No other active cardiopulmonary disease. Electronically Signed   By: Jeannine Boga M.D.   On: 09/01/2016 21:58        Scheduled Meds: . cefTRIAXone (ROCEPHIN)  IV  1 g Intravenous Q24H  . enoxaparin (LOVENOX) injection  30 mg Subcutaneous Q24H  . feeding supplement (ENSURE ENLIVE)  237 mL Oral BID BM  . mirtazapine  15 mg Oral QHS  . sodium chloride  1,000 mL Intravenous Once   Continuous Infusions: . dextrose 5 % with kcl 75 mL/hr at 09/03/16 0918     LOS: 2 days    Time spent: 30 minutes    Loretha Stapler, MD [facility] Pager [phone]  If 7PM-7AM, please contact night-coverage [email or web address] Password [facility] 09/03/2016, 9:47 AM

## 2016-09-03 NOTE — Progress Notes (Signed)
Subjective: Interval History: none.  Objective: Vital signs in last 24 hours: Temp:  [97.6 F (36.4 C)-98.2 F (36.8 C)] 97.6 F (36.4 C) (03/27 0700) Pulse Rate:  [76-82] 76 (03/27 0700) Resp:  [18] 18 (03/27 0700) BP: (104-107)/(59-69) 107/59 (03/27 0700) SpO2:  [100 %] 100 % (03/27 0700) Weight change:   Intake/Output from previous day: 03/26 0701 - 03/27 0700 In: 920.4 [P.O.:360; I.V.:560.4] Out: -  Intake/Output this shift: Total I/O In: -  Out: 1 [Urine:1]  General appearance: alert and no distress Resp: clear to auscultation bilaterally Cardio: regular rate and rhythm Extremities: No edema  Lab Results:  Recent Labs  09/01/16 2123 09/02/16 0358  WBC 7.5 6.5  HGB 15.8* 13.4  HCT 48.6* 41.9  PLT 136* 106*   BMET:  Recent Labs  09/02/16 1748 09/03/16 0609  NA 160* 154*  K 3.7 3.4*  CL 130* 124*  CO2 25 25  GLUCOSE 118* 125*  BUN 38* 29*  CREATININE 1.79* 1.58*  CALCIUM 9.3 8.8*   No results for input(s): PTH in the last 72 hours. Iron Studies: No results for input(s): IRON, TIBC, TRANSFERRIN, FERRITIN in the last 72 hours.  Studies/Results: Dg Chest Portable 1 View  Result Date: 09/01/2016 CLINICAL DATA:  Initial evaluation for acute generalized weakness. EXAM: PORTABLE CHEST 1 VIEW COMPARISON:  None available. FINDINGS: Cardiac and mediastinal silhouettes are partially obscured, but grossly within normal limits. Few calcified mediastinal lymph nodes noted on the left. Lungs are hypoinflated. Mild bibasilar and perihilar vascular congestion, likely related to shallow lung inflation. No frank pulmonary edema. No pleural effusion. No focal infiltrates. No pneumothorax. No acute osseus abnormality. IMPRESSION: 1. Shallow lung inflation with secondary mild bibasilar bronchovascular crowding. 2. No other active cardiopulmonary disease. Electronically Signed   By: Jeannine Boga M.D.   On: 09/01/2016 21:58    I have reviewed the patient's current  medications.  Assessment/Plan: Problem #1 hyponatremia: Most likely secondary to lack of free water. Presently her sodium is 154 and is improving. Problem #2 renal failure: Possibly acute. Her BUN/creatinine continued to improve. Most likely secondary to prerenal syndrome Problem #3 hypercalcemia: Her calcium has improved Problem #4 hypokalemia: Her potassium has declined Problem #5 severe dementia: Patient is alert but doesn't, acute and answer questions..  Problem #6 GERD Plan: We'll change her IV fluid to D5 water with 20 mEq of KCl at 75 mL per hour. We'll check her renal panel in the morning.   LOS: 2 days   Aeriel Boulay S 09/03/2016,8:04 AM

## 2016-09-04 LAB — RENAL FUNCTION PANEL
Albumin: 2.9 g/dL — ABNORMAL LOW (ref 3.5–5.0)
Anion gap: 6 (ref 5–15)
BUN: 20 mg/dL (ref 6–20)
CHLORIDE: 120 mmol/L — AB (ref 101–111)
CO2: 24 mmol/L (ref 22–32)
Calcium: 9.1 mg/dL (ref 8.9–10.3)
Creatinine, Ser: 1.43 mg/dL — ABNORMAL HIGH (ref 0.44–1.00)
GFR calc Af Amer: 38 mL/min — ABNORMAL LOW (ref >60)
GFR, EST NON AFRICAN AMERICAN: 33 mL/min — AB (ref >60)
Glucose, Bld: 115 mg/dL — ABNORMAL HIGH (ref 65–99)
POTASSIUM: 3.4 mmol/L — AB (ref 3.5–5.1)
Phosphorus: 2.7 mg/dL (ref 2.5–4.6)
Sodium: 150 mmol/L — ABNORMAL HIGH (ref 135–145)

## 2016-09-04 MED ORDER — CEFUROXIME AXETIL 500 MG PO TABS: 500.0000 mg | ORAL_TABLET | Freq: Two times a day (BID) | ORAL | 0 refills | Status: AC

## 2016-09-04 MED ORDER — MIRTAZAPINE 15 MG PO TBDP: 15.0000 mg | ORAL_TABLET | Freq: Every day | ORAL | 0 refills | Status: DC

## 2016-09-04 MED ORDER — ENOXAPARIN SODIUM 40 MG/0.4ML ~~LOC~~ SOLN: 40.0000 mg | SUBCUTANEOUS | Status: DC

## 2016-09-04 NOTE — Clinical Social Work Note (Signed)
LCSW spoke with Casimer Bilis with [facility] and advised of patient's discharge.   LCSW left a message for patient's daughter, [name], advising of patients discharge.   LCSW arranged transportation.   LCSW signing off.      Renn Dirocco, Clydene Pugh, LCSW

## 2016-09-04 NOTE — Care Management Important Message (Signed)
Important Message  Patient Details  Name: Kylie Clark MRN: [id number] Date of Birth: 04-16-1932   Medicare Important Message Given:  Yes    Sherald Barge, RN 09/04/2016, 11:04 AM

## 2016-09-04 NOTE — Discharge Summary (Signed)
Physician Discharge Summary  Kylie Clark YTK:[id number] DOB: 10-14-1931 DOA: 09/01/2016  PCP: No PCP Per Patient  Admit date: 09/01/2016 Discharge date: 09/04/2016  Admitted From: ALF Disposition:  ALF  Recommendations for Outpatient Follow-up:  1. Follow up with facility MD within 1 week 2. Get BMP in 2 days 3. Monitor and log PO intake for the next 5 days 4. Please give log to MD to review at next visit 5. Follow up on urine culture from [facility]  Home Health: No Equipment/Devices: None   Discharge Condition:Stable CODE STATUS: Full code  Diet recommendation: Soft- dysphagia 1 diet   Brief/Interim Summary: Kylie Clark a 83 y.o.femalewith medical history significant of dementia who normally walks with assistance at her SNF , normally nonverbal sent in for weakness and unable to ambulate like she normally does without sitting prematurely. No report of n/v/d. No report of fevers. Pt found to have uti and high Na level and referred for treatment of such. Pt cannot provide hisotory. All info obtained from chart and ed staff.  Patient seen by nephrology and was given IV therapy to improve her hypernatremia.  She was started on Rocephin for her UTI.  She was encouraged to eat and drink by the nursing staff.  Patient was cleared by nephrology to discharge and get outpatient labs.  Discussed with patient's family that she may be starting a decline in PO intake secondary to dementia.  I expressed concern that she may experience dehydration and hypernatremia again if she does not eat or drink.  Family state that she was previously doing well.  They believe the urinary infection is causing these changes.  Encouraged patient family to get updates on patient's PO intake from ALF.  Discharge Diagnoses:  Principal Problem:   Hypernatremia Active Problems:   Dementia   UTI (urinary tract infection)   Weakness generalized   AKI (acute kidney injury) ([place])    Dehydration    Discharge Instructions  Discharge Instructions    Call MD for:  difficulty breathing, headache or visual disturbances    Complete by:  As directed    Call MD for:  extreme fatigue    Complete by:  As directed    Call MD for:  hives    Complete by:  As directed    Call MD for:  persistant dizziness or light-headedness    Complete by:  As directed    Call MD for:  persistant nausea and vomiting    Complete by:  As directed    Call MD for:  severe uncontrolled pain    Complete by:  As directed    Call MD for:  temperature >100.4    Complete by:  As directed    Discharge instructions    Complete by:  As directed    1. Keep track of nutrition and hydration status at facility- keep a log of at least 3-5 days and discuss with facility MD at next visit 2. BMP in 2 days 3. Encourage PO intake of both water and food   Increase activity slowly    Complete by:  As directed      Allergies as of 09/04/2016   No Known Allergies     Medication List    STOP taking these medications   furosemide 40 MG tablet Commonly known as:  LASIX     TAKE these medications   acetaminophen 500 MG tablet Commonly known as:  TYLENOL Take 1,000 mg by mouth every 6 (six)  hours as needed for mild pain or moderate pain.   ACID REDUCER 75 MG tablet Generic drug:  ranitidine Take 75 mg by mouth 2 (two) times daily.   alum & mag hydroxide-simeth 200-200-20 MG/5ML suspension Commonly known as:  MAALOX/MYLANTA Take 15 mLs by mouth every 6 (six) hours as needed for indigestion or heartburn.   aspirin EC 81 MG tablet Take 81 mg by mouth daily.   cefUROXime 500 MG tablet Commonly known as:  CEFTIN Take 1 tablet (500 mg total) by mouth 2 (two) times daily with a meal.   docusate sodium 100 MG capsule Commonly known as:  COLACE Take 100 mg by mouth daily.   donepezil 10 MG tablet Commonly known as:  ARICEPT Take one tablet by mouth every night What changed:  how much to  take  how to take this  when to take this  additional instructions   guaifenesin 100 MG/5ML syrup Commonly known as:  ROBITUSSIN Take 200 mg by mouth 3 (three) times daily as needed for cough.   mirtazapine 15 MG disintegrating tablet Commonly known as:  REMERON SOL-TAB Take 1 tablet (15 mg total) by mouth at bedtime.   polyethylene glycol powder powder Commonly known as:  GLYCOLAX/MIRALAX Take 17 g by mouth at bedtime.   potassium chloride SA 20 MEQ tablet Commonly known as:  K-DUR,KLOR-CON Take 20 mEq by mouth daily.       No Known Allergies  Consultations:  Nephrology  CM   Procedures/Studies: Dg Chest Portable 1 View  Result Date: 09/01/2016 CLINICAL DATA:  Initial evaluation for acute generalized weakness. EXAM: PORTABLE CHEST 1 VIEW COMPARISON:  None available. FINDINGS: Cardiac and mediastinal silhouettes are partially obscured, but grossly within normal limits. Few calcified mediastinal lymph nodes noted on the left. Lungs are hypoinflated. Mild bibasilar and perihilar vascular congestion, likely related to shallow lung inflation. No frank pulmonary edema. No pleural effusion. No focal infiltrates. No pneumothorax. No acute osseus abnormality. IMPRESSION: 1. Shallow lung inflation with secondary mild bibasilar bronchovascular crowding. 2. No other active cardiopulmonary disease. Electronically Signed   By: Jeannine Boga M.D.   On: 09/01/2016 21:58       Subjective: Patient noncommunicative today.  Per nursing staff tech has tried to feed patient numerous times and patient has refused almost all PO.  I shared my concern about patient's PO intake with daughter and son.  Discharge Exam: Vitals:   09/03/16 2138 09/04/16 0600  BP: (!) 98/54 104/68  Pulse:  80  Resp: 18 18  Temp: 98.8 F (37.1 C) 98.9 F (37.2 C)   Vitals:   09/03/16 0700 09/03/16 1600 09/03/16 2138 09/04/16 0600  BP: (!) 107/59 (!) 94/51 (!) 98/54 104/68  Pulse: 76 77  80  Resp:  18 14 18 18   Temp: 97.6 F (36.4 C) 98.5 F (36.9 C) 98.8 F (37.1 C) 98.9 F (37.2 C)  TempSrc: Axillary Axillary  Axillary  SpO2: 100% 100% 100% 99%  Weight:      Height:        General: Pt is awake, not in acute distress Cardiovascular: RRR, S1/S2 +, no rubs, no gallops Respiratory: CTA bilaterally, no wheezing, no rhonchi Abdominal: Soft, NT, ND, bowel sounds + Extremities: no edema, no cyanosis    The results of significant diagnostics from this hospitalization (including imaging, microbiology, ancillary and laboratory) are listed below for reference.     Microbiology: Recent Results (from the past 240 hour(s))  Urine culture     Status: Abnormal (  Preliminary result)   Collection Time: 09/01/16  9:17 PM  Result Value Ref Range Status   Specimen Description URINE, RANDOM  Final   Special Requests NONE  Final   Culture (A)  Final    >=100,000 COLONIES/mL GRAM NEGATIVE RODS IDENTIFICATION AND SUSCEPTIBILITIES TO FOLLOW Performed at [facility], [address], [place], [place] [address]    Report Status PENDING  Incomplete  MRSA PCR Screening     Status: None   Collection Time: 09/02/16 12:31 AM  Result Value Ref Range Status   MRSA by PCR NEGATIVE NEGATIVE Final    Comment:        The GeneXpert MRSA Assay (FDA approved for NASAL specimens only), is one component of a comprehensive MRSA colonization surveillance program. It is not intended to diagnose MRSA infection nor to guide or monitor treatment for MRSA infections.      Labs: BNP (last 3 results) No results for input(s): BNP in the last 8760 hours. Basic Metabolic Panel:  Recent Labs Lab 09/01/16 2123 09/02/16 0358 09/02/16 1748 09/03/16 0609 09/04/16 0616  NA 162* 162* 160* 154* 150*  K 4.4 4.2 3.7 3.4* 3.4*  CL 129* >130* 130* 124* 120*  CO2 24 22 25 25 24   GLUCOSE 162* 114* 118* 125* 115*  BUN 47* 43* 38* 29* 20  CREATININE 2.22* 1.97* 1.79* 1.58* 1.43*  CALCIUM 10.7* 9.0 9.3  8.8* 9.1  PHOS  --   --   --  2.6 2.7   Liver Function Tests:  Recent Labs Lab 09/01/16 2123 09/03/16 0609 09/04/16 0616  AST 25  --   --   ALT 12*  --   --   ALKPHOS 117  --   --   BILITOT 0.7  --   --   PROT 8.2*  --   --   ALBUMIN 4.0 2.8* 2.9*   No results for input(s): LIPASE, AMYLASE in the last 168 hours. No results for input(s): AMMONIA in the last 168 hours. CBC:  Recent Labs Lab 09/01/16 2123 09/02/16 0358  WBC 7.5 6.5  NEUTROABS 5.5  --   HGB 15.8* 13.4  HCT 48.6* 41.9  MCV 99.8 99.8  PLT 136* 106*   Cardiac Enzymes:  Recent Labs Lab 09/01/16 2123 09/03/16 1832  TROPONINI 0.03* <0.03   BNP: Invalid input(s): POCBNP CBG: No results for input(s): GLUCAP in the last 168 hours. D-Dimer No results for input(s): DDIMER in the last 72 hours. Hgb A1c No results for input(s): HGBA1C in the last 72 hours. Lipid Profile No results for input(s): CHOL, HDL, LDLCALC, TRIG, CHOLHDL, LDLDIRECT in the last 72 hours. Thyroid function studies No results for input(s): TSH, T4TOTAL, T3FREE, THYROIDAB in the last 72 hours.  Invalid input(s): FREET3 Anemia work up No results for input(s): VITAMINB12, FOLATE, FERRITIN, TIBC, IRON, RETICCTPCT in the last 72 hours. Urinalysis    Component Value Date/Time   COLORURINE YELLOW 09/01/2016 2117   APPEARANCEUR CLOUDY (A) 09/01/2016 2117   LABSPEC 1.013 09/01/2016 2117   PHURINE 5.0 09/01/2016 2117   GLUCOSEU NEGATIVE 09/01/2016 2117   HGBUR MODERATE (A) 09/01/2016 2117   BILIRUBINUR NEGATIVE 09/01/2016 2117   KETONESUR NEGATIVE 09/01/2016 2117   PROTEINUR NEGATIVE 09/01/2016 2117   NITRITE POSITIVE (A) 09/01/2016 2117   LEUKOCYTESUR MODERATE (A) 09/01/2016 2117   Sepsis Labs Invalid input(s): PROCALCITONIN,  WBC,  LACTICIDVEN Microbiology Recent Results (from the past 240 hour(s))  Urine culture     Status: Abnormal (Preliminary result)  Collection Time: 09/01/16  9:17 PM  Result Value Ref Range Status    Specimen Description URINE, RANDOM  Final   Special Requests NONE  Final   Culture (A)  Final    >=100,000 COLONIES/mL GRAM NEGATIVE RODS IDENTIFICATION AND SUSCEPTIBILITIES TO FOLLOW Performed at [facility], [address], [place], [place] [address]    Report Status PENDING  Incomplete  MRSA PCR Screening     Status: None   Collection Time: 09/02/16 12:31 AM  Result Value Ref Range Status   MRSA by PCR NEGATIVE NEGATIVE Final    Comment:        The GeneXpert MRSA Assay (FDA approved for NASAL specimens only), is one component of a comprehensive MRSA colonization surveillance program. It is not intended to diagnose MRSA infection nor to guide or monitor treatment for MRSA infections.      Time coordinating discharge: Over 30 minutes  SIGNED:   Loretha Stapler, MD  [facility] 09/04/2016, 10:58 AM Pager [phone] If 7PM-7AM, please contact night-coverage [email or web address] Password [email or web address]

## 2016-09-04 NOTE — Progress Notes (Signed)
Report called to [facility] staff member [place],medication aid. All questions answered.

## 2016-09-04 NOTE — Care Management Note (Signed)
Case Management Note  Patient Details  Name: Kylie Clark MRN: [id number] Date of Birth: 1932/05/05  Subjective/Objective:                  Admitted with hypernatremia. PTA from [facility]. No HH services pta.   Action/Plan: Plan for return to ALF facility. CSW making arrangements.  Expected Discharge Date:  09/04/16               Expected Discharge Plan:  Assisted Living / Rest Home  In-House Referral:  Clinical Social Work  Discharge planning Services  CM Consult  Post Acute Care Choice:  NA Choice offered to:  NA  Status of Service:  Completed, signed off  If discussed at [place] of Stay Meetings, dates discussed:    Additional Comments:  Sherald Barge, RN 09/04/2016, 11:04 AM

## 2016-09-04 NOTE — Progress Notes (Signed)
Kylie Clark  MRN: [id number]  DOB/AGE: April 26, 1932 83 y.o.  Primary Care Physician:No PCP Per Patient  Admit date: 09/01/2016  Chief Complaint:  Chief Complaint  Patient presents with  . Weakness    S-Pt presented on  09/01/2016 with  Chief Complaint  Patient presents with  . Weakness  .    Pt remains, non communicative, voices no new concerns   Meds . cefTRIAXone (ROCEPHIN)  IV  1 g Intravenous Q24H  . enoxaparin (LOVENOX) injection  30 mg Subcutaneous Q24H  . feeding supplement (ENSURE ENLIVE)  237 mL Oral BID BM  . mirtazapine  15 mg Oral QHS  . sodium chloride  1,000 mL Intravenous Once      Physical Exam: Vital signs in last 24 hours: Temp:  [98.5 F (36.9 C)-98.9 F (37.2 C)] 98.9 F (37.2 C) (03/28 0600) Pulse Rate:  [77-80] 80 (03/28 0600) Resp:  [14-18] 18 (03/28 0600) BP: (94-104)/(51-68) 104/68 (03/28 0600) SpO2:  [99 %-100 %] 99 % (03/28 0600) Weight change:  Last BM Date: 09/02/16  Intake/Output from previous day: 03/27 0701 - 03/28 0700 In: 877.5 [P.O.:150; I.V.:727.5] Out: 4 [Urine:3; Stool:1] No intake/output data recorded.   Physical Exam: General- pt is awake,but remains non communicative Resp- No acute REsp distress,  NO Rhonchi CVS- S1S2 regular in rate and rhythm GIT- BS+, soft, NT, ND EXT- NO LE Edema, no Cyanosis   Lab Results: CBC  Recent Labs  09/01/16 2123 09/02/16 0358  WBC 7.5 6.5  HGB 15.8* 13.4  HCT 48.6* 41.9  PLT 136* 106*    BMET  Recent Labs  09/03/16 0609 09/04/16 0616  NA 154* 150*  K 3.4* 3.4*  CL 124* 120*  CO2 25 24  GLUCOSE 125* 115*  BUN 29* 20  CREATININE 1.58* 1.43*  CALCIUM 8.8* 9.1   Trend Sodium 2018   162=>154=>150  Creat 2018 2.2=> 1.58=>1.43    MICRO Recent Results (from the past 240 hour(s))  Urine culture     Status: Abnormal (Preliminary result)   Collection Time: 09/01/16  9:17 PM  Result Value Ref Range Status   Specimen Description URINE, RANDOM  Final   Special Requests NONE  Final   Culture (A)  Final    >=100,000 COLONIES/mL GRAM NEGATIVE RODS IDENTIFICATION AND SUSCEPTIBILITIES TO FOLLOW Performed at [facility], [address], [place], [place] [address]    Report Status PENDING  Incomplete  MRSA PCR Screening     Status: None   Collection Time: 09/02/16 12:31 AM  Result Value Ref Range Status   MRSA by PCR NEGATIVE NEGATIVE Final    Comment:        The GeneXpert MRSA Assay (FDA approved for NASAL specimens only), is one component of a comprehensive MRSA colonization surveillance program. It is not intended to diagnose MRSA infection nor to guide or monitor treatment for MRSA infections.       Lab Results  Component Value Date   CALCIUM 9.1 09/04/2016   PHOS 2.7 09/04/2016               Impression: 1)Renal  AKI secondary to Prerenal/ATN               AKI sec to Hypovolemia                AKI on CKD               CKD stage 3 .  CKD since 2014               CKD secondary to age 9 decline                Progression of CKD marked with AKI               AKI now improving               Creat is trending down  2)CVS- BP on lower side              But stable, not on pressors  3)Anemia HGb still within normal limits         Though trending down          Hemoconcentration ?    4)CNS- hx of dementia  5)Hypernatremia Free water defit dec to dementia Sodium trending down   6)Hypokalemia Being replte  7)Acid base Co2 at goal     Plan:  Will continue current care If d/ed will suggest to check BMet in 2 days to follow trend.     Sharlet Notaro S 09/04/2016, 9:17 AM

## 2016-09-04 NOTE — Progress Notes (Signed)
Discharged to [facility] facility.  Transported via EMS

## 2016-09-04 NOTE — NC FL2 (Signed)
AF[place] MEDICAID FL2 LEVEL OF CARE SCREENING TOOL     IDENTIFICATION  Patient Name: Kylie Clark Birthdate: 08-May-1932 Sex: female Admission Date (Current Location): 09/01/2016  [facility] and [place] Number:  [employer] and Address:  [place] [address], [place]      Provider Number: [id number]  Attending Physician Name and Address:  Eber Jones, MD  Relative Name and Phone Number:       Current Level of Care: Hospital Recommended Level of Care: [place] Prior Approval Number:    Date Approved/Denied:   PASRR Number:    Discharge Plan: Domiciliary (Rest home) (Return to [facility] ALF)    Current Diagnoses: Patient Active Problem List   Diagnosis Date Noted  . UTI (urinary tract infection) 09/01/2016  . Hypernatremia 09/01/2016  . Weakness generalized 09/01/2016  . AKI (acute kidney injury) ([place]) 09/01/2016  . Dehydration 09/01/2016  . Dementia     Orientation RESPIRATION BLADDER Height & Weight        Normal Incontinent Weight: 172 lb 13.5 oz (78.4 kg) Height:  5\' 10"  (177.8 cm)  BEHAVIORAL SYMPTOMS/MOOD NEUROLOGICAL BOWEL NUTRITION STATUS      Incontinent Diet (DIET DYS 1)  AMBULATORY STATUS COMMUNICATION OF NEEDS Skin   Extensive Assist Does not communicate Normal                       Personal Care Assistance Level of Assistance  Bathing, Feeding, Dressing Bathing Assistance: Maximum assistance Feeding assistance: Maximum assistance Dressing Assistance: Maximum assistance     Functional Limitations Info  Sight, Hearing, Speech Sight Info: Adequate Hearing Info: Adequate Speech Info: Impaired (Patient does not talk)    SPECIAL CARE FACTORS FREQUENCY                       Contractures Contractures Info: Not present    Additional Factors Info  Code Status, Psychotropic Code Status Info: Full Code   Psychotropic Info: Remeron         Current  Medications (09/04/2016):  This is the current hospital active medication list Current Facility-Administered Medications  Medication Dose Route Frequency Provider Last Rate Last Dose  . cefTRIAXone (ROCEPHIN) 1 g in dextrose 5 % 50 mL IVPB  1 g Intravenous Q24H Phillips Grout, MD   1 g at 09/03/16 2131  . dextrose 5 % 1,000 mL with potassium chloride 20 mEq infusion   Intravenous Continuous Fran Lowes, MD 75 mL/hr at 09/03/16 0918    . enoxaparin (LOVENOX) injection 30 mg  30 mg Subcutaneous Q24H Eber Jones, MD   30 mg at 09/04/16 1008  . feeding supplement (ENSURE ENLIVE) (ENSURE ENLIVE) liquid 237 mL  237 mL Oral BID BM Eber Jones, MD   237 mL at 09/04/16 1008  . mirtazapine (REMERON SOL-TAB) disintegrating tablet 15 mg  15 mg Oral QHS Eber Jones, MD   15 mg at 09/02/16 2138  . sodium chloride 0.9 % bolus 1,000 mL  1,000 mL Intravenous Once Milton Ferguson, MD         Discharge Medications:   TAKE these medications   acetaminophen 500 MG tablet Commonly known as:  TYLENOL Take 1,000 mg by mouth every 6 (six) hours as needed for mild pain or moderate pain.   ACID REDUCER 75 MG tablet Generic drug:  ranitidine Take 75 mg by mouth 2 (two) times daily.   alum &  mag hydroxide-simeth 200-200-20 MG/5ML suspension Commonly known as:  MAALOX/MYLANTA Take 15 mLs by mouth every 6 (six) hours as needed for indigestion or heartburn.   aspirin EC 81 MG tablet Take 81 mg by mouth daily.   cefUROXime 500 MG tablet Commonly known as:  CEFTIN Take 1 tablet (500 mg total) by mouth 2 (two) times daily with a meal.   docusate sodium 100 MG capsule Commonly known as:  COLACE Take 100 mg by mouth daily.   donepezil 10 MG tablet Commonly known as:  ARICEPT Take one tablet by mouth every night What changed:  how much to take  how to take this  when to take this  additional instructions   guaifenesin 100 MG/5ML syrup Commonly known as:   ROBITUSSIN Take 200 mg by mouth 3 (three) times daily as needed for cough.   mirtazapine 15 MG disintegrating tablet Commonly known as:  REMERON SOL-TAB Take 1 tablet (15 mg total) by mouth at bedtime.   polyethylene glycol powder powder Commonly known as:  GLYCOLAX/MIRALAX Take 17 g by mouth at bedtime.   potassium chloride SA 20 MEQ tablet Commonly known as:  K-DUR,KLOR-CON Take 20 mEq by mouth daily.   Relevant Imaging Results:  Relevant Lab Results:   Additional Information    Marqueze Ramcharan, Clydene Pugh, LCSW

## 2016-09-05 LAB — URINE CULTURE: Culture: >=100000 — AB

## 2016-09-22 ENCOUNTER — Encounter (HOSPITAL_COMMUNITY): Payer: Self-pay | Admitting: Emergency Medicine

## 2016-09-22 ENCOUNTER — Inpatient Hospital Stay (HOSPITAL_COMMUNITY)
Admission: EM | Admit: 2016-09-22 | Discharge: 2016-09-24 | DRG: 682 | Disposition: A | Discharge status: Hospice | Payer: Medicare Other | Attending: Family Medicine | Admitting: Family Medicine

## 2016-09-22 ENCOUNTER — Emergency Department (HOSPITAL_COMMUNITY): Payer: Medicare Other

## 2016-09-22 DIAGNOSIS — N179 Acute kidney failure, unspecified: Secondary | ICD-10-CM | POA: Diagnosis present

## 2016-09-22 DIAGNOSIS — F0390 Unspecified dementia without behavioral disturbance: Secondary | ICD-10-CM | POA: Diagnosis not present

## 2016-09-22 DIAGNOSIS — Z7982 Long term (current) use of aspirin: Secondary | ICD-10-CM

## 2016-09-22 DIAGNOSIS — R627 Adult failure to thrive: Secondary | ICD-10-CM

## 2016-09-22 DIAGNOSIS — Z66 Do not resuscitate: Secondary | ICD-10-CM | POA: Diagnosis present

## 2016-09-22 DIAGNOSIS — Z820 Family history of epilepsy and other diseases of the nervous system: Secondary | ICD-10-CM

## 2016-09-22 DIAGNOSIS — Z515 Encounter for palliative care: Secondary | ICD-10-CM | POA: Diagnosis not present

## 2016-09-22 DIAGNOSIS — R4182 Altered mental status, unspecified: Secondary | ICD-10-CM

## 2016-09-22 DIAGNOSIS — I959 Hypotension, unspecified: Secondary | ICD-10-CM | POA: Diagnosis not present

## 2016-09-22 DIAGNOSIS — R404 Transient alteration of awareness: Secondary | ICD-10-CM | POA: Diagnosis not present

## 2016-09-22 DIAGNOSIS — R531 Weakness: Secondary | ICD-10-CM | POA: Diagnosis not present

## 2016-09-22 DIAGNOSIS — Z7189 Other specified counseling: Secondary | ICD-10-CM

## 2016-09-22 DIAGNOSIS — Z90710 Acquired absence of both cervix and uterus: Secondary | ICD-10-CM

## 2016-09-22 DIAGNOSIS — Z87440 Personal history of urinary (tract) infections: Secondary | ICD-10-CM | POA: Diagnosis not present

## 2016-09-22 DIAGNOSIS — G9341 Metabolic encephalopathy: Secondary | ICD-10-CM | POA: Diagnosis not present

## 2016-09-22 DIAGNOSIS — K219 Gastro-esophageal reflux disease without esophagitis: Secondary | ICD-10-CM | POA: Diagnosis present

## 2016-09-22 DIAGNOSIS — J984 Other disorders of lung: Secondary | ICD-10-CM | POA: Diagnosis not present

## 2016-09-22 DIAGNOSIS — E860 Dehydration: Secondary | ICD-10-CM | POA: Diagnosis not present

## 2016-09-22 DIAGNOSIS — D696 Thrombocytopenia, unspecified: Secondary | ICD-10-CM | POA: Diagnosis not present

## 2016-09-22 DIAGNOSIS — E785 Hyperlipidemia, unspecified: Secondary | ICD-10-CM | POA: Diagnosis not present

## 2016-09-22 DIAGNOSIS — Z79899 Other long term (current) drug therapy: Secondary | ICD-10-CM | POA: Diagnosis not present

## 2016-09-22 DIAGNOSIS — Z7401 Bed confinement status: Secondary | ICD-10-CM | POA: Diagnosis not present

## 2016-09-22 DIAGNOSIS — N183 Chronic kidney disease, stage 3 (moderate): Secondary | ICD-10-CM | POA: Diagnosis present

## 2016-09-22 DIAGNOSIS — E870 Hyperosmolality and hypernatremia: Secondary | ICD-10-CM | POA: Diagnosis present

## 2016-09-22 DIAGNOSIS — R630 Anorexia: Secondary | ICD-10-CM | POA: Diagnosis present

## 2016-09-22 DIAGNOSIS — R279 Unspecified lack of coordination: Secondary | ICD-10-CM | POA: Diagnosis not present

## 2016-09-22 LAB — COMPREHENSIVE METABOLIC PANEL
ALBUMIN: 3.3 g/dL — AB (ref 3.5–5.0)
ALT: 16 U/L (ref 14–54)
AST: 66 U/L — ABNORMAL HIGH (ref 15–41)
Alkaline Phosphatase: 91 U/L (ref 38–126)
BUN: 65 mg/dL — ABNORMAL HIGH (ref 6–20)
CO2: 24 mmol/L (ref 22–32)
Calcium: 9.8 mg/dL (ref 8.9–10.3)
Creatinine, Ser: 3.69 mg/dL — ABNORMAL HIGH (ref 0.44–1.00)
GFR calc Af Amer: 12 mL/min — ABNORMAL LOW (ref >60)
GFR calc non Af Amer: 10 mL/min — ABNORMAL LOW (ref >60)
GLUCOSE: 145 mg/dL — AB (ref 65–99)
POTASSIUM: 3.7 mmol/L (ref 3.5–5.1)
SODIUM: 169 mmol/L — AB (ref 135–145)
TOTAL PROTEIN: 6.8 g/dL (ref 6.5–8.1)
Total Bilirubin: 1.2 mg/dL (ref 0.3–1.2)

## 2016-09-22 LAB — CBC WITH DIFFERENTIAL/PLATELET
BASOS ABS: 0 10*3/uL (ref 0.0–0.1)
Basophils Relative: 0 %
Eosinophils Absolute: 0 10*3/uL (ref 0.0–0.7)
Eosinophils Relative: 0 %
HEMATOCRIT: 45.2 % (ref 36.0–46.0)
Hemoglobin: 14.5 g/dL (ref 12.0–15.0)
LYMPHS ABS: 1 10*3/uL (ref 0.7–4.0)
LYMPHS PCT: 10 %
MCH: 31.9 pg (ref 26.0–34.0)
MCHC: 32.1 g/dL (ref 30.0–36.0)
MCV: 99.6 fL (ref 78.0–100.0)
MONO ABS: 0.6 10*3/uL (ref 0.1–1.0)
Monocytes Relative: 6 %
NEUTROS ABS: 7.9 10*3/uL — AB (ref 1.7–7.7)
Neutrophils Relative %: 84 %
PLATELETS: 123 10*3/uL — AB (ref 150–400)
RBC: 4.54 MIL/uL (ref 3.87–5.11)
RDW: 15.4 % (ref 11.5–15.5)
WBC: 9.5 10*3/uL (ref 4.0–10.5)

## 2016-09-22 LAB — BLOOD GAS, VENOUS
ACID-BASE DEFICIT: 2.2 mmol/L — AB (ref 0.0–2.0)
Bicarbonate: 22.5 mmol/L (ref 20.0–28.0)
FIO2: 21
O2 SAT: 97 %
PCO2 VEN: 40.1 mmHg — AB (ref 44.0–60.0)
PH VEN: 7.365 (ref 7.250–7.430)
PO2 VEN: 103 mmHg — AB (ref 32.0–45.0)
Patient temperature: 37

## 2016-09-22 LAB — BASIC METABOLIC PANEL
BUN: 59 mg/dL — ABNORMAL HIGH (ref 6–20)
CO2: 24 mmol/L (ref 22–32)
Calcium: 9.1 mg/dL (ref 8.9–10.3)
Chloride: >130 mmol/L (ref 101–111)
Creatinine, Ser: 3.48 mg/dL — ABNORMAL HIGH (ref 0.44–1.00)
GFR calc Af Amer: 13 mL/min — ABNORMAL LOW (ref >60)
GFR calc non Af Amer: 11 mL/min — ABNORMAL LOW (ref >60)
Glucose, Bld: 119 mg/dL — ABNORMAL HIGH (ref 65–99)
Potassium: 3.4 mmol/L — ABNORMAL LOW (ref 3.5–5.1)
Sodium: 168 mmol/L (ref 135–145)

## 2016-09-22 LAB — URINALYSIS, ROUTINE W REFLEX MICROSCOPIC
BILIRUBIN URINE: NEGATIVE
GLUCOSE, UA: NEGATIVE mg/dL
KETONES UR: NEGATIVE mg/dL
LEUKOCYTES UA: NEGATIVE
NITRITE: NEGATIVE
PH: 5 (ref 5.0–8.0)
Protein, ur: 30 mg/dL — AB
SPECIFIC GRAVITY, URINE: 1.015 (ref 1.005–1.030)

## 2016-09-22 LAB — PROTIME-INR
INR: 1.46
Prothrombin Time: 17.9 seconds — ABNORMAL HIGH (ref 11.4–15.2)

## 2016-09-22 LAB — I-STAT CG4 LACTIC ACID, ED: LACTIC ACID, VENOUS: 1.81 mmol/L (ref 0.5–1.9)

## 2016-09-22 LAB — LIPASE, BLOOD: Lipase: 41 U/L (ref 11–51)

## 2016-09-22 MED ORDER — SODIUM CHLORIDE 0.9 % IV BOLUS (SEPSIS)
1000.0000 mL | Freq: Once | INTRAVENOUS | Status: AC
  Administered 2016-09-22: 1000 mL via INTRAVENOUS

## 2016-09-22 MED ORDER — BISACODYL 10 MG RE SUPP: 10.0000 mg | Freq: Every day | RECTAL | Status: DC | PRN

## 2016-09-22 MED ORDER — SODIUM CHLORIDE 0.9 % IV BOLUS (SEPSIS)
500.0000 mL | Freq: Once | INTRAVENOUS | Status: AC
  Administered 2016-09-22: 500 mL via INTRAVENOUS

## 2016-09-22 MED ORDER — SODIUM CHLORIDE 0.9 % IV SOLN
INTRAVENOUS | Status: DC
  Administered 2016-09-22: 12:00:00 via INTRAVENOUS

## 2016-09-22 MED ORDER — ACETAMINOPHEN 325 MG PO TABS: 650.0000 mg | ORAL_TABLET | Freq: Four times a day (QID) | ORAL | Status: DC | PRN

## 2016-09-22 MED ORDER — SODIUM CHLORIDE 0.9% FLUSH
3.0000 mL | Freq: Two times a day (BID) | INTRAVENOUS | Status: DC
  Administered 2016-09-22 – 2016-09-23 (×2): 3 mL via INTRAVENOUS

## 2016-09-22 MED ORDER — DEXTROSE 5 % IV SOLN
INTRAVENOUS | Status: DC
  Administered 2016-09-22 – 2016-09-23 (×2): via INTRAVENOUS
  Administered 2016-09-23: 1000 mL via INTRAVENOUS
  Administered 2016-09-24: 03:00:00 via INTRAVENOUS

## 2016-09-22 MED ORDER — VANCOMYCIN HCL IN DEXTROSE 1-5 GM/200ML-% IV SOLN: 1000.0000 mg | INTRAVENOUS | Status: DC

## 2016-09-22 MED ORDER — ONDANSETRON HCL 4 MG/2ML IJ SOLN: 4.0000 mg | Freq: Four times a day (QID) | INTRAMUSCULAR | Status: DC | PRN

## 2016-09-22 MED ORDER — ACETAMINOPHEN 650 MG RE SUPP: 650.0000 mg | Freq: Four times a day (QID) | RECTAL | Status: DC | PRN

## 2016-09-22 MED ORDER — ONDANSETRON HCL 4 MG PO TABS
4.0000 mg | ORAL_TABLET | Freq: Four times a day (QID) | ORAL | Status: DC | PRN
Start: 2016-09-22 — End: 2016-09-24

## 2016-09-22 MED ORDER — VANCOMYCIN HCL IN DEXTROSE 1-5 GM/200ML-% IV SOLN: 1000.0000 mg | Freq: Once | INTRAVENOUS | Status: DC

## 2016-09-22 MED ORDER — NALOXONE HCL 0.4 MG/ML IJ SOLN
0.4000 mg | Freq: Once | INTRAMUSCULAR | Status: AC
  Administered 2016-09-22: 0.4 mg via INTRAVENOUS
  Filled 2016-09-22: qty 1

## 2016-09-22 MED ORDER — PIPERACILLIN-TAZOBACTAM 3.375 G IVPB 30 MIN
3.3750 g | Freq: Once | INTRAVENOUS | Status: AC
  Administered 2016-09-22: 3.375 g via INTRAVENOUS
  Filled 2016-09-22: qty 50

## 2016-09-22 MED ORDER — DEXTROSE 5 % IV SOLN
2.2500 g | Freq: Three times a day (TID) | INTRAVENOUS | Status: DC
  Filled 2016-09-22 (×3): qty 2.25

## 2016-09-22 MED ORDER — HEPARIN SODIUM (PORCINE) 5000 UNIT/ML IJ SOLN
5000.0000 [IU] | Freq: Three times a day (TID) | INTRAMUSCULAR | Status: DC
  Administered 2016-09-22 – 2016-09-23 (×2): 5000 [IU] via SUBCUTANEOUS
  Filled 2016-09-22 (×2): qty 1

## 2016-09-22 MED ORDER — VANCOMYCIN HCL 10 G IV SOLR
1500.0000 mg | Freq: Once | INTRAVENOUS | Status: AC
  Administered 2016-09-22: 1500 mg via INTRAVENOUS
  Filled 2016-09-22: qty 1500

## 2016-09-22 NOTE — H&P (Signed)
History and Physical  Kylie Clark DTO:[id number] DOB: [birth year] DOA: 09/22/2016  PCP: No PCP Per Patient  Patient coming from: [facility]  Chief Complaint: not eating  HPI:  83yow PMH advanced dementia presented from SNF with generalized weakness, poor appetite, anorexia. Initial evaluation revealed hypotension, AKI and severe hypernatremia. Consideration was given to sepsis initially, but with further information and time it became apparent that hypotension was secondary to anorexia. No evidence of sepsis. Admitted for further treatment of AKI and severe hypernatremia.  Per son at beside patient resides at SNF, at baseline non-verbal, spends most of the day sleeping, rarely ambulates and requires total assist except for eating. Patient cannot provide any history.  Patient was eating last Saturday when he saw her, but he and his sister live out of town and he hasn't seen her since until today. He did not receive any phone call from NH. He is unaware of any change to her condition. He asks about a feeding tube.  I d/w NH staff: patient with anorexia, won't open mouth, the most they were able to give was give her a few ice chips. No fever, cough, bleeding, vomiting or diarrhea or apparent change in condition.  Summary of record review: Discharged 3/28 to [facility]. Presented with weakness, found to have UTI, severe hypernatremia. Seen by nephrology and given IVF and ceftriaxone for UTI. Concern expressed at that time for progression of dementia and recurrent dehydration and hypernatremia. Furosemide was stopped on discharge.   ED Course: afebrile, normal HR, no hypoxia, artifactual hypotension?, treated with naloxone, Zosyn, vancomycin, 3 L NS. Pertinent labs: sodium 169, chloride >130, BUN 65, creatinine upt 3.69. AST 66, normal total bilirubin. Plts 123 (similar 08/2016), normal Hgb and WBC. Imaging: CXR independently reviewed: patchy airspace disease left lung, similar to  previous study 3/25. Concur with radiologist interpretation, likely atelectasis.  Review of Systems:  Unobtainable except as above   Past Medical History:  Diagnosis Date  . Dementia   . GERD (gastroesophageal reflux disease)   . Hyperlipidemia     Past Surgical History:  Procedure Laterality Date  . ABDOMINAL HYSTERECTOMY    . CHOLECYSTECTOMY       reports that she has never smoked. She has never used smokeless tobacco. She reports that she does not drink alcohol or use drugs. Mobility: pivots, stands  No Known Allergies  Family History  Problem Relation Age of Onset  . Family history unknown: Yes     Prior to Admission medications   Medication Sig Start Date End Date Taking? Authorizing Provider  acetaminophen (TYLENOL) 500 MG tablet Take 1,000 mg by mouth every 6 (six) hours as needed for mild pain or moderate pain.   Yes Historical Provider, MD  alum & mag hydroxide-simeth (MAALOX/MYLANTA) 200-200-20 MG/5ML suspension Take 15 mLs by mouth every 6 (six) hours as needed for indigestion or heartburn.   Yes Historical Provider, MD  aspirin EC 81 MG tablet Take 81 mg by mouth daily.   Yes Historical Provider, MD  docusate sodium (COLACE) 100 MG capsule Take 100 mg by mouth daily.   Yes Historical Provider, MD  donepezil (ARICEPT) 10 MG tablet Take one tablet by mouth every night Patient taking differently: Take 10 mg by mouth at bedtime. Take one tablet by mouth every night 03/12/13  Yes Mae Loree Fee, FNP  guaifenesin (ROBITUSSIN) 100 MG/5ML syrup Take 200 mg by mouth 3 (three) times daily as needed for cough.   Yes Historical Provider, MD  mirtazapine (REMERON SOL-TAB) 15 MG disintegrating tablet Take 1 tablet (15 mg total) by mouth at bedtime. 09/04/16  Yes Eber Jones, MD  polyethylene glycol powder (GLYCOLAX/MIRALAX) powder Take 17 g by mouth at bedtime.   Yes Historical Provider, MD  potassium chloride SA (K-DUR,KLOR-CON) 20 MEQ tablet Take 20 mEq by mouth  daily.   Yes Historical Provider, MD  ranitidine (ACID REDUCER) 75 MG tablet Take 75 mg by mouth 2 (two) times daily.   Yes Historical Provider, MD    Physical Exam: Vitals:   09/22/16 1330 09/22/16 1345 09/22/16 1400 09/22/16 1415  BP: 93/65 96/67 93/65  90/79  Pulse: 77   81  Resp: 14 12 17 14   Temp:      TempSrc:      SpO2: 95%   99%   Constitutional:  . Appears chronically ill but not toxic, does not rouse or follow commands . Bitemporal wasting Eyes:  . Pupils and irises appear normal . Normal conjunctivae and lids ENMT:  . external ears, nose appear normal . Unable to assess hearing . Lips appear normal  Neck:  . no masses, no lymphadenopathy . no thyromegaly Respiratory:  . CTA bilaterally, no w/r/r.  . Respiratory effort normal.   Cardiovascular:  . RRR, no m/r/g . No LE extremity edema   Abdomen:  . Abdomen appears normal; well-healed vertical lower midline incision, no tenderness or masses . No hernias Musculoskeletal:  . Digits/nails hands: no clubbing, cyanosis, petechiae, infection. Onychomycosis seen right toes . RUE, LUE, RLE, LLE   o strength not assessable, does not follow commands. Tone normal o No apparent tenderness, masses Skin:  . No rashes, lesions, ulcers . palpation of skin: no induration or nodules Neurologic:  . Not assessable Psychiatric:  . judgement and insight not assessable . Mental status o Orientation to person, place, time not assessable  o Mood, affect not assessable  Wt Readings from Last 3 Encounters:  09/01/16 78.4 kg (172 lb 13.5 oz)  06/18/13 76.7 kg (169 lb)  04/19/13 73.5 kg (162 lb)    I have personally reviewed following labs and imaging studies  Labs on Admission:  CBC:  Recent Labs Lab 09/22/16 1042  WBC 9.5  NEUTROABS 7.9*  HGB 14.5  HCT 45.2  MCV 99.6  PLT 791*   Basic Metabolic Panel:  Recent Labs Lab 09/22/16 1042  NA 169*  K 3.7  CL >130*  CO2 24  GLUCOSE 145*  BUN 65*  CREATININE  3.69*  CALCIUM 9.8   Liver Function Tests:  Recent Labs Lab 09/22/16 1042  AST 66*  ALT 16  ALKPHOS 91  BILITOT 1.2  PROT 6.8  ALBUMIN 3.3*    Recent Labs Lab 09/22/16 1042  LIPASE 41   Coagulation Profile:  Recent Labs Lab 09/22/16 1042  INR 1.46   Urine analysis:    Component Value Date/Time   COLORURINE AMBER (A) 09/22/2016 1012   APPEARANCEUR HAZY (A) 09/22/2016 1012   LABSPEC 1.015 09/22/2016 1012   PHURINE 5.0 09/22/2016 1012   GLUCOSEU NEGATIVE 09/22/2016 1012   HGBUR MODERATE (A) 09/22/2016 1012   BILIRUBINUR NEGATIVE 09/22/2016 1012   KETONESUR NEGATIVE 09/22/2016 1012   PROTEINUR 30 (A) 09/22/2016 1012   NITRITE NEGATIVE 09/22/2016 1012   LEUKOCYTESUR NEGATIVE 09/22/2016 1012    Recent Results (from the past 240 hour(s))  Blood Culture (routine x 2)     Status: None (Preliminary result)   Collection Time: 09/22/16 10:43 AM  Result Value Ref Range Status  Specimen Description BLOOD LEFT WRIST  Final   Special Requests   Final    BOTTLES DRAWN AEROBIC ONLY Blood Culture results may not be optimal due to an inadequate volume of blood received in culture bottles   Culture NO GROWTH < 12 HOURS  Final   Report Status PENDING  Incomplete  Blood Culture (routine x 2)     Status: None (Preliminary result)   Collection Time: 09/22/16 10:44 AM  Result Value Ref Range Status   Specimen Description BLOOD LEFT WRIST  Final   Special Requests   Final    BOTTLES DRAWN AEROBIC AND ANAEROBIC Blood Culture results may not be optimal due to an inadequate volume of blood received in culture bottles   Culture NO GROWTH < 12 HOURS  Final   Report Status PENDING  Incomplete      Radiological Exams on Admission: Ct Head Wo Contrast  Result Date: 09/22/2016 CLINICAL DATA:  83 year old female with altered mental status and hypotension. EXAM: CT HEAD WITHOUT CONTRAST TECHNIQUE: Contiguous axial images were obtained from the base of the skull through the vertex  without intravenous contrast. COMPARISON:  01/01/2014 FINDINGS: Brain: No evidence of acute infarction, hemorrhage, hydrocephalus, extra-axial collection or mass lesion/mass effect. Atrophy and chronic small-vessel white matter ischemic changes are again noted. Vascular: No hyperdense vessel or unexpected calcification. Skull: Normal. Negative for fracture or focal lesion. Sinuses/Orbits: No acute finding. Other: None. IMPRESSION: No evidence of acute intracranial abnormality. Atrophy and chronic small-vessel white matter ischemic changes. Electronically Signed   By: Margarette Canada M.D.   On: 09/22/2016 12:00   Dg Chest Port 1 View  Result Date: 09/22/2016 CLINICAL DATA:  AMS, hypotensive EXAM: PORTABLE CHEST 1 VIEW COMPARISON:  09/01/2016 FINDINGS: Midline trachea. Normal heart size. Patient mildly rotated to the right. No pleural effusion or pneumothorax. Clear right lung. Patchy left base airspace disease, similar. IMPRESSION: Patchy left-sided airspace disease is similar and favored to represent atelectasis. Otherwise, no acute disease. Electronically Signed   By: Abigail Miyamoto M.D.   On: 09/22/2016 11:25    EKG: Independently reviewed: SR, no acute changes  Principal Problem:   Hypernatremia Active Problems:   Dementia   AKI (acute kidney injury) (HCC)   FTT (failure to thrive) in adult   Assessment/Plan 1. Severe hypernatremia, hyperchloridemia secondary to profound dehydration, secondary to FTT/poor oral intake, itself secondary to advanced dementia  IVF, slow correction, follow serial BMP  2. AKI, suspect superimposed on CKD stage III.  Prerenal secondary to refusal to eat.  IVF, follow daily BMP  3. Possible acute encephalopathy metabolic secondary to above, superimposed on dementia (nonverbal at baseline). Difficult to assess. Per son, patient is close to baseline.  Treat acute issues above  4. Modest thrombocytopenia, no significant change from 08/2016.   Follow with CBC in  AM  5. Dementia with recurrent FTT  Consult PMT for GOC    Hypotensive, but normal WBC and lactate. U/a and CXR without acute disease. No evidence of infection. S&S explained by history and physical findings. No further abx indicated.  Discussed GOC with son, patient is DNR but otherwise he desires to treat what can be treated and wants to explore a feeding tube (not recommended)  Will consult GI and PMT to present options available and assist with goals of care.  It is my clinical opinion that admission to INPATIENT is reasonable and necessary in this patient  Given the aforementioned, the predictability of an adverse outcome is felt to be  significant. I expect that the patient will require at least 2 midnights in the hospital to treat this condition.  DVT prophylaxis: heparin Code Status: DNR Family Communication: son at bedside (no POA, he will d/w his sister)     Time spent: 37 minutes  Murray Hodgkins, MD  [facility] Direct contact: [phone] --[place] [place]  --[email or web address]; password [email or web address]  7PM-7AM contact night coverage as above  09/22/2016, 3:20 PM

## 2016-09-22 NOTE — ED Triage Notes (Signed)
brough tin by EMS sick call.  Poor nutrition for last week.  Pt has been weak.  Given 500 ml NS bolus .  VSS.  CBG 162.

## 2016-09-22 NOTE — ED Notes (Signed)
CRITICAL VALUE ALERT  Critical value received:  Na+ 169 and Cl >130  Date of notification:  09/22/2016  Time of notification:  1610  Critical value read back:Yes.    Nurse who received alert:  Madelyn Flavors, RN  MD notified (1st page):  Dr Rogene Houston  Time of first page:  [phone]

## 2016-09-22 NOTE — ED Provider Notes (Addendum)
[facility] DEPT Provider Note   CSN: [id number] Arrival date & time: 09/22/16  0920  By signing my name below, I, Jaquelyn Bitter., attest that this documentation has been prepared under the direction and in the presence of Fredia Sorrow, MD. Electronically signed: Jaquelyn Bitter., ED Scribe. 09/22/16. 1:08 PM.   History   Chief Complaint Chief Complaint  Patient presents with  . Poor nutrition   LEVEL 5 CAVEAT  HPI Kylie Clark is a 83 y.o. female who presents to the Emergency Department complaining of poor nutrition with onset x1 week. Pt was seen in x1 month ago with asimilar complaint in which her sodium was elevated. Pt is checked on weekly by EMS and was found to be somnolent this morning. Per EMS, pt has been weak for the past x1 week due to poor nutrition. Pt was administered 500 cc fluid bolus in route to the ED. Pt's oxygen sat is 98% on RA, her blood sugar was measured to be 162. Pt's is hypotensive. Per nurse, pt responds to pain but is somnolent at baseline.    The history is provided by the EMS personnel. The history is limited by the condition of the patient. No language interpreter was used.    Past Medical History:  Diagnosis Date  . Dementia   . GERD (gastroesophageal reflux disease)   . Hyperlipidemia     Patient Active Problem List   Diagnosis Date Noted  . UTI (urinary tract infection) 09/01/2016  . Hypernatremia 09/01/2016  . Weakness generalized 09/01/2016  . AKI (acute kidney injury) ([place]) 09/01/2016  . Dehydration 09/01/2016  . Dementia     Past Surgical History:  Procedure Laterality Date  . ABDOMINAL HYSTERECTOMY    . CHOLECYSTECTOMY      OB History    No data available       Home Medications    Prior to Admission medications   Medication Sig Start Date End Date Taking? Authorizing Provider  acetaminophen (TYLENOL) 500 MG tablet Take 1,000 mg by mouth every 6 (six) hours as needed for mild pain or  moderate pain.   Yes Historical Provider, MD  alum & mag hydroxide-simeth (MAALOX/MYLANTA) 200-200-20 MG/5ML suspension Take 15 mLs by mouth every 6 (six) hours as needed for indigestion or heartburn.   Yes Historical Provider, MD  aspirin EC 81 MG tablet Take 81 mg by mouth daily.   Yes Historical Provider, MD  docusate sodium (COLACE) 100 MG capsule Take 100 mg by mouth daily.   Yes Historical Provider, MD  donepezil (ARICEPT) 10 MG tablet Take one tablet by mouth every night Patient taking differently: Take 10 mg by mouth at bedtime. Take one tablet by mouth every night 03/12/13  Yes Mae Loree Fee, FNP  guaifenesin (ROBITUSSIN) 100 MG/5ML syrup Take 200 mg by mouth 3 (three) times daily as needed for cough.   Yes Historical Provider, MD  mirtazapine (REMERON SOL-TAB) 15 MG disintegrating tablet Take 1 tablet (15 mg total) by mouth at bedtime. 09/04/16  Yes Eber Jones, MD  polyethylene glycol powder (GLYCOLAX/MIRALAX) powder Take 17 g by mouth at bedtime.   Yes Historical Provider, MD  potassium chloride SA (K-DUR,KLOR-CON) 20 MEQ tablet Take 20 mEq by mouth daily.   Yes Historical Provider, MD  ranitidine (ACID REDUCER) 75 MG tablet Take 75 mg by mouth 2 (two) times daily.   Yes Historical Provider, MD    Family History History reviewed. No pertinent family history.  Social History Social  History  Substance Use Topics  . Smoking status: Never Smoker  . Smokeless tobacco: Never Used  . Alcohol use No     Allergies   Patient has no known allergies.   Review of Systems Review of Systems  Unable to perform ROS: Acuity of condition     Physical Exam Updated Vital Signs BP 103/75 (BP Location: Right Arm)   Pulse 92   Temp 97.5 F (36.4 C) (Oral)   Resp 16   SpO2 100%   Physical Exam  Constitutional: She appears well-developed and well-nourished. No distress.  HENT:  Head: Normocephalic and atraumatic.  Eyes: Conjunctivae are normal.  Neck: Neck supple.    Cardiovascular: Regular rhythm.  Tachycardia present.   No murmur heard. Pulmonary/Chest: Effort normal and breath sounds normal. Tachypnea noted. No respiratory distress.  Abdominal: Soft. There is no tenderness.  Abdomen is flat.  Musculoskeletal: She exhibits no edema.  Neurological:  Pt somnolent, not unable to be aroused. Pupils are pinpoint, sclera are clear.  Pt moves her L arm spontaneously.   Skin: Skin is warm and dry.  Psychiatric: She has a normal mood and affect.  Nursing note and vitals reviewed.    ED Treatments / Results   DIAGNOSTIC STUDIES: Oxygen Saturation is 98% on RA, normal by my interpretation.   COORDINATION OF CARE: 1:08 PM-Discussed next steps with pt. Pt verbalized understanding and is agreeable with the plan.    Labs (all labs ordered are listed, but only abnormal results are displayed) Labs Reviewed  COMPREHENSIVE METABOLIC PANEL - Abnormal; Notable for the following:       Result Value   Sodium 169 (*)    Chloride >130 (*)    Glucose, Bld 145 (*)    BUN 65 (*)    Creatinine, Ser 3.69 (*)    Albumin 3.3 (*)    AST 66 (*)    GFR calc non Af Amer 10 (*)    GFR calc Af Amer 12 (*)    All other components within normal limits  CBC WITH DIFFERENTIAL/PLATELET - Abnormal; Notable for the following:    Platelets 123 (*)    Neutro Abs 7.9 (*)    All other components within normal limits  URINALYSIS, ROUTINE W REFLEX MICROSCOPIC - Abnormal; Notable for the following:    Color, Urine AMBER (*)    APPearance HAZY (*)    Hgb urine dipstick MODERATE (*)    Protein, ur 30 (*)    Bacteria, UA FEW (*)    Squamous Epithelial / LPF 0-5 (*)    All other components within normal limits  BLOOD GAS, VENOUS - Abnormal; Notable for the following:    pCO2, Ven 40.1 (*)    pO2, Ven 103.0 (*)    Acid-base deficit 2.2 (*)    All other components within normal limits  PROTIME-INR - Abnormal; Notable for the following:    Prothrombin Time 17.9 (*)    All  other components within normal limits  CULTURE, BLOOD (ROUTINE X 2)  CULTURE, BLOOD (ROUTINE X 2)  URINE CULTURE  LIPASE, BLOOD  I-STAT CG4 LACTIC ACID, ED  I-STAT CG4 LACTIC ACID, ED    EKG  EKG Interpretation  Date/Time:  Sunday September 22 2016 10:35:24 EDT Ventricular Rate:  96 PR Interval:    QRS Duration: 103 QT Interval:  365 QTC Calculation: 462 R Axis:   -10 Text Interpretation:  Sinus rhythm Low voltage, precordial leads No significant change since last tracing Reconfirmed by [employer]  MD, Makeila Yamaguchi [phone]) on 09/22/2016 10:40:26 AM       Radiology Ct Head Wo Contrast  Result Date: 09/22/2016 CLINICAL DATA:  83 year old female with altered mental status and hypotension. EXAM: CT HEAD WITHOUT CONTRAST TECHNIQUE: Contiguous axial images were obtained from the base of the skull through the vertex without intravenous contrast. COMPARISON:  01/01/2014 FINDINGS: Brain: No evidence of acute infarction, hemorrhage, hydrocephalus, extra-axial collection or mass lesion/mass effect. Atrophy and chronic small-vessel white matter ischemic changes are again noted. Vascular: No hyperdense vessel or unexpected calcification. Skull: Normal. Negative for fracture or focal lesion. Sinuses/Orbits: No acute finding. Other: None. IMPRESSION: No evidence of acute intracranial abnormality. Atrophy and chronic small-vessel white matter ischemic changes. Electronically Signed   By: Margarette Canada M.D.   On: 09/22/2016 12:00   Dg Chest Port 1 View  Result Date: 09/22/2016 CLINICAL DATA:  AMS, hypotensive EXAM: PORTABLE CHEST 1 VIEW COMPARISON:  09/01/2016 FINDINGS: Midline trachea. Normal heart size. Patient mildly rotated to the right. No pleural effusion or pneumothorax. Clear right lung. Patchy left base airspace disease, similar. IMPRESSION: Patchy left-sided airspace disease is similar and favored to represent atelectasis. Otherwise, no acute disease. Electronically Signed   By: Abigail Miyamoto M.D.   On:  09/22/2016 11:25    Procedures Procedures (including critical care time)  CRITICAL CARE Performed by: Fredia Sorrow Total critical care time: 45 minutes Critical care time was exclusive of separately billable procedures and treating other patients. Critical care was necessary to treat or prevent imminent or life-threatening deterioration. Critical care was time spent personally by me on the following activities: development of treatment plan with patient and/or surrogate as well as nursing, discussions with consultants, evaluation of patient's response to treatment, examination of patient, obtaining history from patient or surrogate, ordering and performing treatments and interventions, ordering and review of laboratory studies, ordering and review of radiographic studies, pulse oximetry and re-evaluation of patient's condition.   Medications Ordered in ED Medications  0.9 %  sodium chloride infusion ( Intravenous New Bag/Given 09/22/16 1145)  vancomycin (VANCOCIN) 1,500 mg in sodium chloride 0.9 % 500 mL IVPB (1,500 mg Intravenous New Bag/Given 09/22/16 1139)  vancomycin (VANCOCIN) IVPB 1000 mg/200 mL premix (not administered)  piperacillin-tazobactam (ZOSYN) 2.25 g in dextrose 5 % 50 mL IVPB (not administered)  sodium chloride 0.9 % bolus 500 mL (0 mLs Intravenous Stopped 09/22/16 1241)  sodium chloride 0.9 % bolus 1,000 mL (0 mLs Intravenous Stopped 09/22/16 1100)    And  sodium chloride 0.9 % bolus 1,000 mL (0 mLs Intravenous Stopped 09/22/16 1138)    And  sodium chloride 0.9 % bolus 500 mL (0 mLs Intravenous Stopped 09/22/16 1145)  naloxone (NARCAN) injection 0.4 mg (0.4 mg Intravenous Given 09/22/16 1045)  piperacillin-tazobactam (ZOSYN) IVPB 3.375 g (0 g Intravenous Stopped 09/22/16 1116)     Initial Impression / Assessment and Plan / ED Course  I have reviewed the triage vital signs and the nursing notes.  Pertinent labs & imaging results that were available during my care of the  patient were reviewed by me and considered in my medical decision making (see chart for details).     Patient presented with altered mental status clinically appearing very dehydrated. Tachycardic and occasional hypotension. Initially felt that maybe she could be septic. Sepsis protocols were started. However her blood pressure when her arms are positioned properly has a normal systolic. Chest x-ray negative for pneumonia urinalysis negative for urinary tract infection. Patient with severe hypernatremia and renal failure most  likely prerenal. Head CT without any acute findings.  Patient was initially receiving 2 L of normal saline part of it being the sepsis protocol. When blood pressures have appeared to be stable no additional fluids given at this time and rate slowed down. They've been above 462 systolic. Patient wants to keep her arms folded across her chest. And when we have that position we get systolic pressures that are low.  Review of previous admission the end of March of very similar presentation although lab and renal functions worse today.  Patient's blood gas shows no evidence of any significant respiratory failure. Patient is breathing fine on her own. Will move spontaneously at times. But will not follow commands.  Patient will require admission. Do not feel the patient requires intubation at this time.  Discussed with family. Patient is a full code.  Patient will require continued correction of the hypernatremia.    Final Clinical Impressions(s) / ED Diagnoses   Final diagnoses:  Hypernatremia  Dehydration, severe  Altered mental status, unspecified altered mental status type    New Prescriptions New Prescriptions   No medications on file   I personally performed the services described in this documentation, which was scribed in my presence. The recorded information has been reviewed and is accurate.       Fredia Sorrow, MD 09/22/16 [place],  MD 09/22/16 1345

## 2016-09-22 NOTE — Progress Notes (Signed)
Pharmacy Antibiotic Note  Kylie Clark is a 83 y.o. female admitted on 09/22/2016 with sepsis.  Pharmacy has been consulted for Vancomycin and Zosyn dosing. Zosyn 3.375 GM IV given in ED Calculated CrCl 13 ml/min  Plan: Vancomycin 1500 mg IV loading dose, then Vancomycin 1 GM IV every 48 hours.  Goal trough 15-20 mcg/mL.  Zosyn 2.25 GM IV every 8 hours Monitor renal function Labs per protocol    Temp (24hrs), Avg:97.5 F (36.4 C), Min:97.5 F (36.4 C), Max:97.5 F (36.4 C)   Recent Labs Lab 09/22/16 1042  WBC 9.5  CREATININE 3.69*    CrCl cannot be calculated (Unknown ideal weight.).    No Known Allergies  Antimicrobials this admission: Vancomcycin 4/15 >>  Zosyn 4/15 >>     Thank you for allowing pharmacy to be a part of this patient's care.  Chriss Czar 09/22/2016 12:13 PM

## 2016-09-23 ENCOUNTER — Encounter (HOSPITAL_COMMUNITY): Payer: Self-pay | Admitting: Gastroenterology

## 2016-09-23 DIAGNOSIS — Z515 Encounter for palliative care: Secondary | ICD-10-CM

## 2016-09-23 DIAGNOSIS — E860 Dehydration: Secondary | ICD-10-CM

## 2016-09-23 DIAGNOSIS — E870 Hyperosmolality and hypernatremia: Secondary | ICD-10-CM

## 2016-09-23 DIAGNOSIS — Z7189 Other specified counseling: Secondary | ICD-10-CM

## 2016-09-23 DIAGNOSIS — D696 Thrombocytopenia, unspecified: Secondary | ICD-10-CM

## 2016-09-23 LAB — CBC
HCT: 37.3 % (ref 36.0–46.0)
Hemoglobin: 11.8 g/dL — ABNORMAL LOW (ref 12.0–15.0)
MCH: 31.5 pg (ref 26.0–34.0)
MCHC: 31.6 g/dL (ref 30.0–36.0)
MCV: 99.5 fL (ref 78.0–100.0)
PLATELETS: 88 10*3/uL — AB (ref 150–400)
RBC: 3.75 MIL/uL — ABNORMAL LOW (ref 3.87–5.11)
RDW: 15.6 % — AB (ref 11.5–15.5)
WBC: 5.9 10*3/uL (ref 4.0–10.5)

## 2016-09-23 LAB — BASIC METABOLIC PANEL
BUN: 54 mg/dL — AB (ref 6–20)
CO2: 22 mmol/L (ref 22–32)
CREATININE: 3.1 mg/dL — AB (ref 0.44–1.00)
Calcium: 8.9 mg/dL (ref 8.9–10.3)
Chloride: >130 mmol/L (ref 101–111)
GFR calc Af Amer: 15 mL/min — ABNORMAL LOW (ref >60)
GFR, EST NON AFRICAN AMERICAN: 13 mL/min — AB (ref >60)
Glucose, Bld: 138 mg/dL — ABNORMAL HIGH (ref 65–99)
Potassium: 3.2 mmol/L — ABNORMAL LOW (ref 3.5–5.1)
Sodium: 165 mmol/L (ref 135–145)

## 2016-09-23 LAB — MRSA PCR SCREENING: MRSA BY PCR: NEGATIVE

## 2016-09-23 MED ORDER — ORAL CARE MOUTH RINSE: 15.0000 mL | Freq: Two times a day (BID) | OROMUCOSAL | Status: DC

## 2016-09-23 MED ORDER — CHLORHEXIDINE GLUCONATE 0.12 % MT SOLN
15.0000 mL | Freq: Two times a day (BID) | OROMUCOSAL | Status: DC
  Administered 2016-09-23 – 2016-09-24 (×3): 15 mL via OROMUCOSAL
  Filled 2016-09-23 (×2): qty 15

## 2016-09-23 NOTE — Progress Notes (Signed)
MD notified of Na+ of 165. No new orders

## 2016-09-23 NOTE — Progress Notes (Signed)
Called report to [facility] on [phone] who will be getting patient in room 324

## 2016-09-23 NOTE — Consult Note (Signed)
Consultation Note Date: 09/23/2016   Clark Name: Kylie Clark  DOB: Apr 06, 1932  MRN: [id number]  Age / Sex: 83 y.o., female  PCP: No Pcp Per Clark Referring Physician: Samuella Cota, MD  Reason for Consultation: Establishing goals of care and Psychosocial/spiritual support  HPI/Clark Profile: 83 y.o. female  with past medical history of GER D, hyperlipidemia, dementia, ALF residents times 3 years, nonverbal times one year admitted on 09/22/2016 with severe hypernatremia secondary to profound dehydration.   Clinical Assessment and Goals of Care: Kylie Clark is resting quietly in bed. Her granddaughter, Kylie Clark, is at bedside. Kylie Clark's food tray is at bedside, but she does not take any food or liquid at this time. [place] not talk about some normal and expected changes with dementia related to nutrition. Kylie Clark states her father will be at the hospital later today.  I returned to see Kylie Clark later in the day, and her son Kylie Clark is at bedside, along with his daughter Kylie Clark, and an older female family friend.  We talk about the chronic illness pathway, what is normal and expected, and I share a diagram. I share my concern that those with memory loss do not recover as well. We talk about Kylie Clark's life over the last few years. Guys states that she walked to the ALF "kicking and screaming", and he has seen her decline over the last 3 years to being nonverbal over the last year, and bedbound. Kylie Clark also states that his mother would walk several miles every day for the last 40 years until she was in the ALF. 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  We talk about nutrition and hydration in detail. I share that people can get free water through PEG tube, but people are not kept alive with IV fluids. We talk about the risks of PEG tube including body image disturbance, risk  for aspiration, risk for skin breakdown.  I encourage Kylie Clark to consider what his mother would and would not want, the person she was 10 years ago, what would she say about where she is now. Guys states that his mother would not see this as any quality of life. We talk about medicine extending life, but also extending the dying process. Asks how long Kylie Clark could live without eating and drinking, and if they choose not to have a PEG tube placed, what her life look like, and where would she be.  Granddaughter Kylie Clark brings up hospice. I share that the hospice home is a wonderful and loving place, and if Kylie Clark is unable to eat and drink to sustain her she may have only 3 weeks to live, and she would be accepted at the hospice home.  I share with Kylie Clark that he does not need to make any decisions  today, we plan to meet again tomorrow.  Healthcare power of attorney HCPOA son Kylie Clark, and daughter Kylie Clark share healthcare power of attorney   SUMMARY OF RECOMMENDATIONS  continue to treat the treatable but no CPR or intubation. Continue discussions related to PEG tube placement, continue discussions related to normal natural progression of dementia.  Code Status/Advance Care Planning:  DNR  Symptom Management:   per hospitalist, no additional needs at this time.  Palliative Prophylaxis:   Aspiration and Turn Reposition  Additional Recommendations (Limitations, Scope, Preferences):  Continue to treat the treatable but no CPR or intubation. Considering benefits of PEG tube  Psycho-social/Spiritual:   Desire for further Chaplaincy support:no  Additional Recommendations: Caregiving  Support/Resources and Education on Hospice  Prognosis:   < 3 months would not be surprising based on 2 hospitalizations in the last month, decreased by mouth intake, bedbound status, nonverbal times one year  Discharge Planning: To Be Determined      Primary Diagnoses: Present on  Admission: . AKI (acute kidney injury) ([place]) . Hypernatremia . Dementia   I have reviewed the medical record, interviewed the Clark and family, and examined the Clark. The following aspects are pertinent.  Past Medical History:  Diagnosis Date  . Dementia   . GERD (gastroesophageal reflux disease)   . Hyperlipidemia    Social History   Social History  . Marital status: Divorced    Spouse name: N/A  . Number of children: N/A  . Years of education: N/A   Social History Main Topics  . Smoking status: Never Smoker  . Smokeless tobacco: Never Used  . Alcohol use No  . Drug use: No  . Sexual activity: Not Asked   Other Topics Concern  . None   Social History Narrative  . None   Family History  Problem Relation Age of Onset  . Alzheimer's disease Father   . Alzheimer's disease Sister    Scheduled Meds: . chlorhexidine  15 mL Mouth Rinse BID  . mouth rinse  15 mL Mouth Rinse q12n4p  . sodium chloride flush  3 mL Intravenous Q12H   Continuous Infusions: . dextrose 900 mL (09/23/16 1000)   PRN Meds:.acetaminophen **OR** acetaminophen, bisacodyl, ondansetron **OR** ondansetron (ZOFRAN) IV Medications Prior to Admission:  Prior to Admission medications   Medication Sig Start Date End Date Taking? Authorizing Provider  acetaminophen (TYLENOL) 500 MG tablet Take 1,000 mg by mouth every 6 (six) hours as needed for mild pain or moderate pain.   Yes Historical Provider, MD  alum & mag hydroxide-simeth (MAALOX/MYLANTA) 200-200-20 MG/5ML suspension Take 15 mLs by mouth every 6 (six) hours as needed for indigestion or heartburn.   Yes Historical Provider, MD  aspirin EC 81 MG tablet Take 81 mg by mouth daily.   Yes Historical Provider, MD  docusate sodium (COLACE) 100 MG capsule Take 100 mg by mouth daily.   Yes Historical Provider, MD  donepezil (ARICEPT) 10 MG tablet Take one tablet by mouth every night Clark taking differently: Take 10 mg by mouth at bedtime. Take one  tablet by mouth every night 03/12/13  Yes Mae Loree Fee, FNP  guaifenesin (ROBITUSSIN) 100 MG/5ML syrup Take 200 mg by mouth 3 (three) times daily as needed for cough.   Yes Historical Provider, MD  mirtazapine (REMERON SOL-TAB) 15 MG disintegrating tablet Take 1 tablet (15 mg total) by mouth at bedtime. 09/04/16  Yes Eber Jones, MD  polyethylene glycol powder (GLYCOLAX/MIRALAX) powder Take 17 g by mouth at bedtime.   Yes Historical Provider, MD  potassium chloride SA (K-DUR,KLOR-CON) 20 MEQ tablet Take 20 mEq by mouth daily.   Yes Historical Provider, MD  ranitidine (ACID REDUCER)  75 MG tablet Take 75 mg by mouth 2 (two) times daily.   Yes Historical Provider, MD   No Known Allergies Review of Systems  Unable to perform ROS: Clark nonverbal    Physical Exam  Constitutional: She appears well-nourished. No distress.  Nonverbal, makes but does not keep eye contact  HENT:  Head: Normocephalic and atraumatic.  Cardiovascular: Normal rate and regular rhythm.   Pulmonary/Chest: Effort normal. No respiratory distress.  Abdominal: Soft. She exhibits no distension.  Musculoskeletal: She exhibits no edema.  Neurological: She is alert.  Nonverbal, unable to determine orientation  Skin: Skin is warm and dry.  Nursing note and vitals reviewed.   Vital Signs: BP 101/68   Pulse 65   Temp 97.4 F (36.3 C) (Axillary)   Resp 13   Ht 5\' 10"  (1.778 m)   Wt 77 kg (169 lb 12.1 oz)   SpO2 100%   BMI 24.36 kg/m  Pain Assessment: PAINAD POSS *See Group Information*: 1-Acceptable,Awake and alert Pain Score: Asleep   SpO2: SpO2: 100 % O2 Device:SpO2: 100 % O2 Flow Rate: .   IO: Intake/output summary:  Intake/Output Summary (Last 24 hours) at 09/23/16 1303 Last data filed at 09/23/16 1000  Gross per 24 hour  Intake          1611.25 ml  Output                0 ml  Net          1611.25 ml    LBM:   Baseline Weight: Weight: 77.5 kg (170 lb 13.7 oz) Most recent weight: Weight:  77 kg (169 lb 12.1 oz)     Palliative Assessment/Data:   Flowsheet Rows     Most Recent Value  Intake Tab  Referral Department  Hospitalist  Unit at Time of Referral  Med/Surg Unit  Palliative Care Primary Diagnosis  Neurology  Date Notified  09/22/16  Palliative Care Type  New Palliative care  Reason for referral  Clarify Goals of Care  Date of Admission  09/22/16  Date first seen by Palliative Care  09/23/16  # of days Palliative referral response time  1 Day(s)  # of days IP prior to Palliative referral  0  Clinical Assessment  Palliative Performance Scale Score  20%  Pain Max last 24 hours  Not able to report  Pain Min Last 24 hours  Not able to report  Dyspnea Max Last 24 Hours  Not able to report  Dyspnea Min Last 24 hours  Not able to report  Psychosocial & Spiritual Assessment  Palliative Care Outcomes  Clark/Family meeting held?  Yes  Who was at the meeting?  With granddaughter Kylie Clark at bedside  [place]  Provided psychosocial or spiritual support, Provided advance care planning  Clark/Family wishes: Interventions discontinued/not started   Mechanical Ventilation      Time In: 1250 Time Out: 1400 Time Total: 70 minutes Greater than 50%  of this time was spent counseling and coordinating care related to the above assessment and plan.  Signed by: Drue Novel, NP   Please contact Palliative Medicine Team phone at [phone] for questions and concerns.  For individual provider: See Shea Evans

## 2016-09-23 NOTE — Consult Note (Signed)
Referring Provider: Dr. Sarajane Jews  Primary Care Physician:  No PCP Per Patient Primary Gastroenterologist:  Dr. Gala Romney   Date of Admission: 09/22/16 Date of Consultation: 09/23/16  Reason for Consultation:  Consideration for PEG tube   HPI:  Kylie Clark is an 83 y.o. year old female with history of advanced dementia, residing at a [facility] facility as an outpatient, brought to the ED with weakness, poor appetite, anorexia, and found to have acute kidney injury and severe hypernatremia. No sepsis. At baseline, patient has been non-verbal for at least the past year per her daughter. Poor intake noted from review of admitting H&P. Unable to obtain history from patient.   She is hypotensive today, with some improvement in hypernatremia since admission but still with significant hypernatremia. I spoke with her daughter, Kylie Clark ([phone]). I was unable to reach her son, Kylie Clark [phone]) but left a message. Son has expressed desire to explore a feeding tube.   I spoke with Kylie Clark, who states she would like to know if patient has the potential to improve, and if so, she would want to pursue a feeding tube. However, if it appears patient is declining and nearing end of life, she would not want to put her mother through unnecessary procedures or suffering.   Past Medical History:  Diagnosis Date  . Dementia   . GERD (gastroesophageal reflux disease)   . Hyperlipidemia     Past Surgical History:  Procedure Laterality Date  . ABDOMINAL HYSTERECTOMY    . CHOLECYSTECTOMY      Prior to Admission medications   Medication Sig Start Date End Date Taking? Authorizing Provider  acetaminophen (TYLENOL) 500 MG tablet Take 1,000 mg by mouth every 6 (six) hours as needed for mild pain or moderate pain.   Yes Historical Provider, MD  alum & mag hydroxide-simeth (MAALOX/MYLANTA) 200-200-20 MG/5ML suspension Take 15 mLs by mouth every 6 (six) hours as needed for  indigestion or heartburn.   Yes Historical Provider, MD  aspirin EC 81 MG tablet Take 81 mg by mouth daily.   Yes Historical Provider, MD  docusate sodium (COLACE) 100 MG capsule Take 100 mg by mouth daily.   Yes Historical Provider, MD  donepezil (ARICEPT) 10 MG tablet Take one tablet by mouth every night Patient taking differently: Take 10 mg by mouth at bedtime. Take one tablet by mouth every night 03/12/13  Yes Mae Loree Fee, FNP  guaifenesin (ROBITUSSIN) 100 MG/5ML syrup Take 200 mg by mouth 3 (three) times daily as needed for cough.   Yes Historical Provider, MD  mirtazapine (REMERON SOL-TAB) 15 MG disintegrating tablet Take 1 tablet (15 mg total) by mouth at bedtime. 09/04/16  Yes Eber Jones, MD  polyethylene glycol powder (GLYCOLAX/MIRALAX) powder Take 17 g by mouth at bedtime.   Yes Historical Provider, MD  potassium chloride SA (K-DUR,KLOR-CON) 20 MEQ tablet Take 20 mEq by mouth daily.   Yes Historical Provider, MD  ranitidine (ACID REDUCER) 75 MG tablet Take 75 mg by mouth 2 (two) times daily.   Yes Historical Provider, MD    Current Facility-Administered Medications  Medication Dose Route Frequency Provider Last Rate Last Dose  . acetaminophen (TYLENOL) tablet 650 mg  650 mg Oral Q6H PRN Samuella Cota, MD       Or  . acetaminophen (TYLENOL) suppository 650 mg  650 mg Rectal Q6H PRN Samuella Cota, MD      . bisacodyl (DULCOLAX) suppository 10 mg  10 mg  Rectal Daily PRN Samuella Cota, MD      . chlorhexidine (PERIDEX) 0.12 % solution 15 mL  15 mL Mouth Rinse BID Samuella Cota, MD      . dextrose 5 % solution   Intravenous Continuous Samuella Cota, MD 75 mL/hr at 09/23/16 0918 1,000 mL at 09/23/16 0918  . MEDLINE mouth rinse  15 mL Mouth Rinse q12n4p Samuella Cota, MD      . ondansetron [facility]) tablet 4 mg  4 mg Oral Q6H PRN Samuella Cota, MD       Or  . ondansetron [facility]) injection 4 mg  4 mg Intravenous Q6H PRN Samuella Cota, MD      .  sodium chloride flush (NS) 0.9 % injection 3 mL  3 mL Intravenous Q12H Samuella Cota, MD   3 mL at 09/23/16 0919    Allergies as of 09/22/2016  . (No Known Allergies)    Family History  Problem Relation Age of Onset  . Alzheimer's disease Father   . Alzheimer's disease Sister     Social History   Social History  . Marital status: Divorced    Spouse name: N/A  . Number of children: N/A  . Years of education: N/A   Occupational History  . Not on file.   Social History Main Topics  . Smoking status: Never Smoker  . Smokeless tobacco: Never Used  . Alcohol use No  . Drug use: No  . Sexual activity: Not on file   Other Topics Concern  . Not on file   Social History Narrative  . No narrative on file    Review of Systems: Unable to obtain due to cognitive status.   Physical Exam: Vital signs in last 24 hours: Temp:  [97.4 F (36.3 C)-99.6 F (37.6 C)] 97.4 F (36.3 C) (04/16 0759) Pulse Rate:  [16-98] 66 (04/16 0700) Resp:  [12-26] 12 (04/16 0700) BP: (78-109)/(42-90) 81/62 (04/16 0700) SpO2:  [94 %-100 %] 100 % (04/16 0700) Weight:  [169 lb 12.1 oz (77 kg)-170 lb 13.7 oz (77.5 kg)] 169 lb 12.1 oz (77 kg) (04/16 0400)   General:   Resting in bed with eyes closed, does not open eyes to verbal stimuli or follow commands. Moaning occasionally but does not appear in distress.  Head:  Normocephalic and atraumatic. Eyes:  Sclera clear, no icterus.   Mouth:  Dentures noted in upper palate Lungs:  Clear throughout to auscultation Heart:  Regular rate and rhythm Abdomen:  Soft, nontender and nondistended. hypoactive bowel sounds. No masses, hepatosplenomegaly or hernias noted.  Rectal:  Deferred  Extremities:  Without edema. Neurologic:  Unable to assess Psych:  Eyes closed, no distress   Intake/Output from previous day: 04/15 0701 - 04/16 0700 In: 3861.3 [I.V.:811.3; IV Piggyback:3050] Out: -  Intake/Output this shift: Total I/O In: 150 [I.V.:150] Out: -    Lab Results:  Recent Labs  09/22/16 1042 09/23/16 0500  WBC 9.5 5.9  HGB 14.5 11.8*  HCT 45.2 37.3  PLT 123* 88*   BMET  Recent Labs  09/22/16 1042 09/22/16 1845 09/23/16 0500  NA 169* 168* 165*  K 3.7 3.4* 3.2*  CL >130* >130* >130*  CO2 24 24 22   GLUCOSE 145* 119* 138*  BUN 65* 59* 54*  CREATININE 3.69* 3.48* 3.10*  CALCIUM 9.8 9.1 8.9   LFT  Recent Labs  09/22/16 1042  PROT 6.8  ALBUMIN 3.3*  AST 66*  ALT 16  ALKPHOS 91  BILITOT 1.2   PT/INR  Recent Labs  09/22/16 1042  LABPROT 17.9*  INR 1.46    Studies/Results: Ct Head Wo Contrast  Result Date: 09/22/2016 CLINICAL DATA:  83 year old female with altered mental status and hypotension. EXAM: CT HEAD WITHOUT CONTRAST TECHNIQUE: Contiguous axial images were obtained from the base of the skull through the vertex without intravenous contrast. COMPARISON:  01/01/2014 FINDINGS: Brain: No evidence of acute infarction, hemorrhage, hydrocephalus, extra-axial collection or mass lesion/mass effect. Atrophy and chronic small-vessel white matter ischemic changes are again noted. Vascular: No hyperdense vessel or unexpected calcification. Skull: Normal. Negative for fracture or focal lesion. Sinuses/Orbits: No acute finding. Other: None. IMPRESSION: No evidence of acute intracranial abnormality. Atrophy and chronic small-vessel white matter ischemic changes. Electronically Signed   By: Margarette Canada M.D.   On: 09/22/2016 12:00   Dg Chest Port 1 View  Result Date: 09/22/2016 CLINICAL DATA:  AMS, hypotensive EXAM: PORTABLE CHEST 1 VIEW COMPARISON:  09/01/2016 FINDINGS: Midline trachea. Normal heart size. Patient mildly rotated to the right. No pleural effusion or pneumothorax. Clear right lung. Patchy left base airspace disease, similar. IMPRESSION: Patchy left-sided airspace disease is similar and favored to represent atelectasis. Otherwise, no acute disease. Electronically Signed   By: Abigail Miyamoto M.D.   On: 09/22/2016  11:25    Impression: 83 year old female with failure to thrive, admitted with severe hypernatremia and dehydration in setting of advanced dementia, hypotension, acute renal injury. I was unable to speak with son today, but I was able to reach patient's daughter. She states that if patient has the potential to recover, she would want to consider a PEG tube. However, if patient's body is failing and nearing end of life, she would not want to pursue this or cause more suffering. Unclear if son and daughter are on the same page at this point.   From a GI standpoint, she is not a candidate for a PEG tube placement currently with electrolyte abnormalities and hypotension. I spoke with daughter regarding the risks of a PEG tube, including the risk of aspiration. If family continues to desire PEG tube placement, this would need to be done when patient is more clinically stable. Would not recommend a PEG tube in this scenario, as it appears she is nearing end of life and steadily declining. Will continue to follow and discuss with family when available (I have placed a call to the son as well).  Hopefully, palliative care consult can help family to process options and interventions moving forward.   Plan: Contacted son and awaiting return call Supportive measures  Will follow with you Agree with palliative care  Annitta Needs, ANP-BC [facility]      LOS: 1 day    09/23/2016, 9:47 AM

## 2016-09-23 NOTE — Care Management Note (Signed)
Case Management Note  Patient Details  Name: SAHITI JOSWICK MRN: [id number] Date of Birth: May 19, 1932  Subjective/Objective:   Adm from [facility] with hypernatremia/weakness/FTT. GI/Palliative consults pending. Family wants a PEG placed.               Action/Plan: Anticipate DC back to [facility]. Spiritual consult placed. CSW aware of admission.    Expected Discharge Date:                  Expected Discharge Plan:  Assisted Living / Rest Home  In-House Referral:  Clinical Social Work  Discharge planning Services  CM Consult  Post Acute Care Choice:  NA Choice offered to:  NA  DME Arranged:    DME Agency:     HH Arranged:    HH Agency:     Status of Service:  In process, will continue to follow  If discussed at Long Length of Stay Meetings, dates discussed:    Additional Comments:  Yaritzy Huser, Chauncey Reading, RN 09/23/2016, 11:24 AM

## 2016-09-23 NOTE — Progress Notes (Signed)
PROGRESS NOTE  Kylie Clark LKT:[id number] DOB: November 18, 1931 DOA: 09/22/2016 PCP: No PCP Per Patient  Brief Narrative: 11yow PMH advanced dementia presented from SNF with generalized weakness, poor appetite, anorexia. Initial evaluation revealed hypotension, AKI and severe hypernatremia. Consideration was given to sepsis initially, but with further information and time it became apparent that hypotension was secondary to anorexia. No evidence of sepsis. Admitted for further treatment of AKI and severe hypernatremia.  Assessment/Plan 1. Severe hypernatremia, profound dehydration, FTT: secondary to advanced dementia. Mild hypotension noted, secondary to dehydration. Perfusion appears intact. No evidence of infection.  Sodium slightly better.  Continue D5W and follow BMP  2. AKI superimposed on CKD stage III, secondary to FTT.  Modest improvement thus far. Continue IVF, check BMP in AM.  3. Acute encephalopathy, metabolic, secondary to above  Improved, now opens eyes, appears closer to described baseline  4. Modest thrombocytopenia. No further workup planned at this point.  Check CBC in AM  5. Advanced dementia with recurrent FTT  D/w son yesterday, second episode in 1 month. Began discussion of advanced dementia and nutrition issues. He desires to explore all options including feeding tube (not recommended). Will involve PMT and GI to help work through [employer]. I would recommend supportive/comfort care only.   Continue IVF, labs in AM  Transfer to medical floor.  DVT prophylaxis: SCDs Code Status: DNR Family Communication: none Disposition Plan: pending    Murray Hodgkins, MD  [facility] Direct contact: [phone] --Via amion app OR  --[email or web address]; password [email or web address]  7PM-7AM contact night coverage as above 09/23/2016, 8:40 AM  LOS: 1 day   Consultants:  GI  Palliative medicine  Procedures:    Antimicrobials:    Interval history/Subjective: Nonverbal  (baseline). No issues reported overnight per nursing.  Objective: Vitals:   09/23/16 0500 09/23/16 0600 09/23/16 0700 09/23/16 0759  BP: (!) 88/69 (!) 81/65 (!) 81/62   Pulse: 71 71 66   Resp: 12 13 12    Temp:    97.4 F (36.3 C)  TempSrc:    Axillary  SpO2: 100% 100% 100%   Weight:      Height:        Intake/Output Summary (Last 24 hours) at 09/23/16 0840 Last data filed at 09/23/16 0800  Gross per 24 hour  Intake          4011.25 ml  Output                0 ml  Net          4011.25 ml     Filed Weights   09/22/16 1654 09/23/16 0400  Weight: 77.5 kg (170 lb 13.7 oz) 77 kg (169 lb 12.1 oz)    Exam:    Constitutional: appears calm, comfortable, ill but not toxic. Opens eyes spontaneously but does not follow commands Eyes: Pupils round and equal, normal lids and irises, lids and conjunctiva ENT: lips appear unremarkable CV: RRR no m/r/g. No LE edema. Resp: CTA bilaterally, no w/r/r. Normal respiratory effort. Abd: soft, ntnd Skin: no rash or induration noted, no nodules or pain noted Psych: cannot assess mood or affect   I have personally reviewed the following:   Labs:  BMP: Sodium 165, cholride >130, K+ 3.2  CBC: Plts 88, Hgb 11.8, WBC 5.9  Imaging studies:    Medical tests:    Scheduled Meds: . chlorhexidine  15 mL Mouth Rinse BID  . mouth rinse  15 mL Mouth Rinse q12n4p  . sodium chloride flush  3 mL Intravenous Q12H   Continuous Infusions: . dextrose 75 mL/hr at 09/22/16 1911    Principal Problem:   Hypernatremia Active Problems:   Dementia   AKI (acute kidney injury) (H[facility])   FTT (failure to thrive) in adult   LOS: 1 day

## 2016-09-24 DIAGNOSIS — Z515 Encounter for palliative care: Secondary | ICD-10-CM

## 2016-09-24 LAB — BASIC METABOLIC PANEL
Anion gap: 6 (ref 5–15)
BUN: 38 mg/dL — AB (ref 6–20)
CHLORIDE: 123 mmol/L — AB (ref 101–111)
CO2: 24 mmol/L (ref 22–32)
CREATININE: 2.27 mg/dL — AB (ref 0.44–1.00)
Calcium: 8.9 mg/dL (ref 8.9–10.3)
GFR calc Af Amer: 22 mL/min — ABNORMAL LOW (ref >60)
GFR calc non Af Amer: 19 mL/min — ABNORMAL LOW (ref >60)
Glucose, Bld: 149 mg/dL — ABNORMAL HIGH (ref 65–99)
POTASSIUM: 3.1 mmol/L — AB (ref 3.5–5.1)
SODIUM: 153 mmol/L — AB (ref 135–145)

## 2016-09-24 LAB — URINE CULTURE: Culture: NO GROWTH

## 2016-09-24 MED ORDER — MORPHINE SULFATE (CONCENTRATE) 10 MG/0.5ML PO SOLN
2.5000 mg | ORAL | Status: DC | PRN
  Administered 2016-09-24: 2.6 mg via SUBLINGUAL
  Filled 2016-09-24: qty 0.5

## 2016-09-24 NOTE — Clinical Social Work Note (Signed)
LCSW spoke with Palliative Care NP, Quinn Axe, who indicated that family was agreeable to [facility].   LCSW spoke with Jenny Reichmann, with [facility], and was advised that beds were available and to sent referral.  LCSW faxed electronically patient's referral to [facility] for review.    Mliss Wedin, Clydene Pugh, LCSW

## 2016-09-24 NOTE — Consult Note (Signed)
   [facility] CM Inpatient Consult   09/24/2016  DJENEBA BARSCH 23-Mar-1932 [id number]   Patient screened for potential [place] Management services. Patient is eligible for [place]. Electronic medical record reveals patient's discharge plan is Hospice Home. [facility] Care Management services not appropriate at this time. If patient's post hospital needs change please place a [facility] Care Management consult. For questions please contact:   Kjerstin Abrigo RN, [facility] Liaison  [phone]) Business Mobile [phone]) Toll free office

## 2016-09-24 NOTE — Clinical Social Work Note (Signed)
Patient Information   Patient Name Kylie Clark, Kylie Clark ([id number]) Sex Female DOB 1932/04/29 SSN [id number]  Room Bed  A324 A324-01  Patient Demographics   Address [address] [place] [address]  Patient Ethnicity & Race   Ethnic Group Patient Race  Not Hispanic or Latino Black or African American  Emergency Contact(s)   Name Relation Home Work [place] Daughter [phone]    Berenda Morale [phone]    Julio Alm [phone]    Documents on File    Status Date Received Description  Documents for the Patient  Driver's License Not Received    [place] Not Received    [place] E-Signature HIPAA Notice of Privacy Received 01/04/11   [place] E-Signature HIPAA Notice of Privacy Spanish Not Received    Insurance Card Received 01/04/11   Advance Directives/Living Will/HCPOA/POA Not Received    Financial Application Not Received    Insurance Card Not Received    Advanced Beneficiary Notice (ABN) Not Received    Insurance Card Received 02/04/13   Insurance Card Received 02/04/13 [facility]/[employer]  Release of Information Received 02/04/13 [facility]/MM  AMB Provider Completed Forms  07/04/13 [place]-2  AMB HH/NH/Hospice  07/13/13 01/15 order Pilar Grammes of [place]  Other Photo ID Not Received    Documents for the Encounter  AOB (Assignment of Insurance Benefits) Not Received    E-signature AOB Unable to Obtain 09/22/16   MEDICARE RIGHTS Not Received    E-signature Medicare Rights Unable to Obtain 09/22/16   ED Patient Billing Extract   ED PB Summary  ED Patient Billing Extract   ED Encounter Summary  Cardiac Monitoring Strip Shift Summary  09/22/16   Cardiac Monitoring Strip  09/22/16   EKG  09/23/16   Admission Information   Attending Provider Admitting Provider Admission Type Admission Date/Time  Samuella Cota, MD Samuella Cota, MD  09/22/16 0920  Discharge Date Hospital Service Auth/Cert Status  Service Area   Internal Medicine Incomplete [place]  Unit Room/Bed Admission Status   AP-DEPT 300 A324/A324-01 Admission (Confirmed)   Admission   Complaint  ---  Hospital Account   Name Acct ID Class Status Primary Coverage  [name], [name] [id number] Inpatient [place]      Guarantor Account (for Hospital Account [id number])   Name Relation to [place]? Acct Type  [name] Self CHSA Yes Personal/Family  Address Phone    [address] [place], [place] [address] [phone])        Coverage Information (for Hospital Account [id number])   1. [facility] MEDICARE/UHC MEDICARE   F/O Payor/Plan Precert #  [facility] [place] Subscriber #  [name], [name] [id number]  [place]  [address] [place] [address] [phone]  2. MEDICAID [place]/MEDICAID [place] ACCESS   F/O Payor/Plan Precert #  MEDICAID [place]/MEDICAID [place] ACCESS   Subscriber Subscriber #  [name], [name] [id number] [facility]  Address Phone  [address] [place], [place] [address] [phone]

## 2016-09-24 NOTE — Discharge Summary (Signed)
Physician Discharge Summary  Kylie Clark WNU:[id number] DOB: [birth year] DOA: 09/22/2016  PCP: No PCP Per Patient  Admit date: 09/22/2016 Discharge date: 09/24/2016  Transfer to [facility]idential [facility]ice   Discharge Diagnoses:  1. Severe hypernatremia 2. AKI 3. dehdyration 4. FTT 5. Advanced dementia 6. Acute metabolic encephalopathy 7. CKD stage III  Discharge Condition: stable, prognosis poor, estimate days to weeks Disposition: residential hospice  Diet recommendation: as desired  Filed Weights   09/22/16 1654 09/23/16 0400  Weight: 77.5 kg (170 lb 13.7 oz) 77 kg (169 lb 12.1 oz)    History of present illness:  83yow PMH advanced dementia presented from SNF with generalized weakness, poor appetite, anorexia. Initial evaluation revealed hypotension, AKI and severe hypernatremia. Consideration was given to sepsis initially, but with further information and time it became apparent that hypotension was secondary to anorexia. No evidence of sepsis. Admitted for further treatment of AKI and severe hypernatremia.  Hospital Course:  Patient was treated with IVF with improvement in renal function and appropriate decrease in sodium. Condition complicated by advanced dementia with FTT and recurrent dehydration and renal failure. Care was discussed with son/POA as well as palliative medicine and GI, as son wished to explore options available including feeding tube. GI recommended against FT. After further discussion the sondeclined feeding tube and expressed that patient wound not want aggressive care. He subsequently decided to pursue full comfort care and transfer to residential hospice. As sustained recovery is not expected with advanced dementia, this was felt by the POA and medical team to be in the patient's best interest.  Consultants:   Palliative Medicine  GI   Today's assessment: S: nonverbal O: Vitals:   09/23/16 2124 09/24/16 0514  BP: (!) 103/45 124/74  Pulse: 72 77    Resp: 18 18  Temp: 97.5 F (36.4 C) 98.1 F (36.7 C)   General: appears calm and comfortable, sleeping CV: RRR no m/r/g. Resp: CTA bilaterally, no w/r/r. Normal respiratory effort.  BMP reviewed: improved sodium, creatinine. Modest hypokalemia not corrected based on goals of care.   Discharge Instructions   Allergies as of 09/24/2016   No Known Allergies     Medication List    STOP taking these medications   acetaminophen 500 MG tablet Commonly known as:  TYLENOL   ACID REDUCER 75 MG tablet Generic drug:  ranitidine   alum & mag hydroxide-simeth 200-200-20 MG/5ML suspension Commonly known as:  MAALOX/MYLANTA   aspirin EC 81 MG tablet   docusate sodium 100 MG capsule Commonly known as:  COLACE   donepezil 10 MG tablet Commonly known as:  ARICEPT   guaifenesin 100 MG/5ML syrup Commonly known as:  ROBITUSSIN   mirtazapine 15 MG disintegrating tablet Commonly known as:  REMERON SOL-TAB   polyethylene glycol powder powder Commonly known as:  GLYCOLAX/MIRALAX   potassium chloride SA 20 MEQ tablet Commonly known as:  K-DUR,KLOR-CON      No Known Allergies  The results of significant diagnostics from this hospitalization (including imaging, microbiology, ancillary and laboratory) are listed below for reference.    Significant Diagnostic Studies: Ct Head Wo Contrast  Result Date: 09/22/2016 CLINICAL DATA:  83 year old female with altered mental status and hypotension. EXAM: CT HEAD WITHOUT CONTRAST TECHNIQUE: Contiguous axial images were obtained from the base of the skull through the vertex without intravenous contrast. COMPARISON:  01/01/2014 FINDINGS: Brain: No evidence of acute infarction, hemorrhage, hydrocephalus, extra-axial collection or mass lesion/mass effect. Atrophy and chronic small-vessel white matter ischemic changes are again noted.  Vascular: No hyperdense vessel or unexpected calcification. Skull: Normal. Negative for fracture or focal lesion.  Sinuses/Orbits: No acute finding. Other: None. IMPRESSION: No evidence of acute intracranial abnormality. Atrophy and chronic small-vessel white matter ischemic changes. Electronically Signed   By: Margarette Canada M.D.   On: 09/22/2016 12:00   Dg Chest Port 1 View  Result Date: 09/22/2016 CLINICAL DATA:  AMS, hypotensive EXAM: PORTABLE CHEST 1 VIEW COMPARISON:  09/01/2016 FINDINGS: Midline trachea. Normal heart size. Patient mildly rotated to the right. No pleural effusion or pneumothorax. Clear right lung. Patchy left base airspace disease, similar. IMPRESSION: Patchy left-sided airspace disease is similar and favored to represent atelectasis. Otherwise, no acute disease. Electronically Signed   By: Abigail Miyamoto M.D.   On: 09/22/2016 11:25    Microbiology: Recent Results (from the past 240 hour(s))  Urine culture     Status: None   Collection Time: 09/22/16 10:12 AM  Result Value Ref Range Status   Specimen Description URINE, RANDOM  Final   Special Requests NONE  Final   Culture   Final    NO GROWTH Performed at [facility], [address], [place], [place] [address]    Report Status 09/24/2016 FINAL  Final  Blood Culture (routine x 2)     Status: None (Preliminary result)   Collection Time: 09/22/16 10:43 AM  Result Value Ref Range Status   Specimen Description BLOOD LEFT WRIST  Final   Special Requests   Final    BOTTLES DRAWN AEROBIC ONLY Blood Culture results may not be optimal due to an inadequate volume of blood received in culture bottles   Culture NO GROWTH 2 DAYS  Final   Report Status PENDING  Incomplete  Blood Culture (routine x 2)     Status: None (Preliminary result)   Collection Time: 09/22/16 10:44 AM  Result Value Ref Range Status   Specimen Description BLOOD LEFT WRIST  Final   Special Requests   Final    BOTTLES DRAWN AEROBIC AND ANAEROBIC Blood Culture results may not be optimal due to an inadequate volume of blood received in culture bottles   Culture NO  GROWTH 2 DAYS  Final   Report Status PENDING  Incomplete  MRSA PCR Screening     Status: None   Collection Time: 09/22/16  6:15 PM  Result Value Ref Range Status   MRSA by PCR NEGATIVE NEGATIVE Final    Comment:        The GeneXpert MRSA Assay (FDA approved for NASAL specimens only), is one component of a comprehensive MRSA colonization surveillance program. It is not intended to diagnose MRSA infection nor to guide or monitor treatment for MRSA infections.      Labs: Basic Metabolic Panel:  Recent Labs Lab 09/22/16 1042 09/22/16 1845 09/23/16 0500 09/24/16 0950  NA 169* 168* 165* 153*  K 3.7 3.4* 3.2* 3.1*  CL >130* >130* >130* 123*  CO2 24 24 22 24   GLUCOSE 145* 119* 138* 149*  BUN 65* 59* 54* 38*  CREATININE 3.69* 3.48* 3.10* 2.27*  CALCIUM 9.8 9.1 8.9 8.9   Liver Function Tests:  Recent Labs Lab 09/22/16 1042  AST 66*  ALT 16  ALKPHOS 91  BILITOT 1.2  PROT 6.8  ALBUMIN 3.3*    Recent Labs Lab 09/22/16 1042  LIPASE 41   CBC:  Recent Labs Lab 09/22/16 1042 09/23/16 0500  WBC 9.5 5.9  NEUTROABS 7.9*  --   HGB 14.5 11.8*  HCT 45.2 37.3  MCV 99.6 99.5  PLT 123* 88*    Principal Problem:   Hypernatremia Active Problems:   Dementia   AKI (acute kidney injury) ([place])   FTT (failure to thrive) in adult   Palliative care encounter   Goals of care, counseling/discussion   Encounter for admission to hospice care   Time coordinating discharge: 25 minutes  Signed:  Murray Hodgkins, MD [facility] 09/24/2016, 2:14 PM

## 2016-09-24 NOTE — Progress Notes (Signed)
Daily Progress Note   Patient Name: Kylie Clark       Date: 09/24/2016 DOB: 1931/07/23  Age: 83 y.o. MRN#: [id number] Attending Physician: Samuella Cota, MD Primary Care Physician: No PCP Per Patient Admit Date: 09/22/2016  Reason for Consultation/Follow-up: Establishing goals of care, Inpatient hospice referral and Psychosocial/spiritual support  Subjective: Kylie Clark is resting quietly in bed. She makes but does not keep eye contact as I enter. I offer her liquid from her tray but she turns her head and states "no". I ask if she would like some ice cream, she replies "yeah".  When I offer her a spoon full of magic cup, again she turns her head. I sit quietly with Kylie Clark for a few moments. I share with her "you don't have to eat if you don't want to". She replies "yeah".  No family of bedside at this time.  Call to son, [name]. Luvenia Heller states that after considering his mother's wishes, the family is requesting that Kylie Clark be transferred to hospice home of [facility] to focus on comfort and dignity. We talk about food coming from [facility], they will offer Kylie Clark food if she wants to eat.  I share that NO IV fluids or IV antibiotics will be given. I review medication list, but there are no medications that do not focus on comfort. I share with Luvenia Heller that often people will be unburdened from cholesterol medications and other medications that are not changing outcomes.  Luvenia Heller agrees to the treatment plan of hospice. He states that he is coming to the hospital soon, he also wants to visit the hospice home of [facility]. Luvenia Heller shares that several family members have spoken very well of the hospice home, they have friends or relatives who've taken the  services.  Conference with social worker to set in motion transfer to hospice home of [place].  Length of Stay: 2  Current Medications: Scheduled Meds:  . chlorhexidine  15 mL Mouth Rinse BID  . mouth rinse  15 mL Mouth Rinse q12n4p  . sodium chloride flush  3 mL Intravenous Q12H    Continuous Infusions: . dextrose 125 mL/hr at 09/24/16 0318    PRN Meds: acetaminophen **OR** acetaminophen, bisacodyl, ondansetron **OR** ondansetron (ZOFRAN) IV  Physical Exam  Constitutional: No distress.  Briefly makes but does not keep eye contact. Calm, cooperative.  HENT:  Head: Normocephalic and atraumatic.  Cardiovascular: Normal rate and regular rhythm.   Pulmonary/Chest: Effort normal. No respiratory distress.  Abdominal: Soft. She exhibits no distension. There is no guarding.  Musculoskeletal: She exhibits no edema.  Neurological: She is alert.  Simple yes and no answers today  Skin: Skin is warm and dry.  Vitals reviewed.           Vital Signs: BP 124/74 (BP Location: Left Arm)   Pulse 77   Temp 98.1 F (36.7 C) (Oral)   Resp 18   Ht 5\' 10"  (1.778 m)   Wt 77 kg (169 lb 12.1 oz)   SpO2 97%   BMI 24.36 kg/m  SpO2: SpO2: 97 % O2 Device: O2 Device: Not Delivered O2 Flow Rate:    Intake/output summary:  Intake/Output Summary (Last 24 hours) at 09/24/16 1259 Last data filed at 09/24/16 0900  Gross per 24 hour  Intake             2500 ml  Output                0 ml  Net             2500 ml   LBM: Last BM Date: 09/24/16 Baseline Weight: Weight: 77.5 kg (170 lb 13.7 oz) Most recent weight: Weight: 77 kg (169 lb 12.1 oz)       Palliative Assessment/Data:    Flowsheet Rows     Most Recent Value  Intake Tab  Referral Department  Hospitalist  Unit at Time of Referral  Med/Surg Unit  Palliative Care Primary Diagnosis  Neurology  Date Notified  09/22/16  Palliative Care Type  New Palliative care  Reason for referral  Clarify Goals of Care  Date of  Admission  09/22/16  Date first seen by Palliative Care  09/23/16  # of days Palliative referral response time  1 Day(s)  # of days IP prior to Palliative referral  0  Clinical Assessment  Palliative Performance Scale Score  20%  Pain Max last 24 hours  Not able to report  Pain Min Last 24 hours  Not able to report  Dyspnea Max Last 24 Hours  Not able to report  Dyspnea Min Last 24 hours  Not able to report  Psychosocial & Spiritual Assessment  Palliative Care Outcomes  Patient/Family meeting held?  Yes  Who was at the meeting?  With granddaughter Randell Patient at bedside  [place]  Provided psychosocial or spiritual support, Provided advance care planning  Patient/Family wishes: Interventions discontinued/not started   Mechanical Ventilation      Patient Active Problem List   Diagnosis Date Noted  . Palliative care encounter   . Goals of care, counseling/discussion   . FTT (failure to thrive) in adult 09/22/2016  . UTI (urinary tract infection) 09/01/2016  . Hypernatremia 09/01/2016  . Weakness generalized 09/01/2016  . AKI (acute kidney injury) ([place]) 09/01/2016  . Dehydration, severe 09/01/2016  . Dementia     Palliative Care Assessment & Plan   Patient Profile: 83 y.o. female  with past medical history of GER D, hyperlipidemia, dementia, ALF residents times 3 years, nonverbal times one year admitted on 09/22/2016 with severe hypernatremia secondary to profound dehydration.   Assessment: End-stage dementia;  Bed bound state, nonverbal times one year except for simple yes or no answers, now progress to the point of declining PO intake. Recurrent dehydration; related  to end-stage dementia, poor PO intake.  Recommendations/Plan:  family is requesting transition to [facility] home to focus on comfort and dignity.  Goals of Care and Additional Recommendations:  Limitations on Scope of Treatment: Full Comfort Care  Code Status:    Code Status  Orders        Start     Ordered   09/22/16 1702  Do not attempt resuscitation (DNR)  Continuous    Question Answer Comment  In the event of cardiac or respiratory ARREST Do not call a "code blue"   In the event of cardiac or respiratory ARREST Do not perform Intubation, CPR, defibrillation or ACLS   In the event of cardiac or respiratory ARREST Use medication by any route, position, wound care, and other measures to relive pain and suffering. May use oxygen, suction and manual treatment of airway obstruction as needed for comfort.      09/22/16 1701    Code Status History    Date Active Date Inactive Code Status Order ID Comments User Context   09/02/2016 12:09 AM 09/04/2016 10:02 PM Full Code [id number]  Phillips Grout, MD Inpatient       Prognosis:   < 4 weeks, likely based on 2 hospitalizations in the last one month for recurrent profound dehydration with sodium 165, also families desire to focus on comfort and dignity, no IV fluids, no IV antibiotics, nope PEG tube, let nature take its course.   Discharge Planning:  Family is requesting transition to the hospice home of [facility] was discussed with nursing staff, case manager, social worker, and Dr. Sarajane Jews.   Thank you for allowing the Palliative Medicine Team to assist in the care of this patient.   Time In: 1020 Time Out: 1100 Total Time 40 minutes Prolonged Time Billed  no       Greater than 50%  of this time was spent counseling and coordinating care related to the above assessment and plan.  Drue Novel, NP  Please contact Palliative Medicine Team phone at [phone] for questions and concerns.

## 2016-09-24 NOTE — Clinical Social Work Note (Signed)
LCSW confirmed that [facility] could accept patient today.    LCSW met with patent's son who was at bedside. He was agreeable to [facility] placement. He stated that he would go to the hospice home to sign paperwork.   LCSW notified attending via [place] that patient had been accepted at [facility].      Briget Shaheed, Clydene Pugh, LCSW

## 2016-09-24 NOTE — Care Management Important Message (Signed)
Important Message  Patient Details  Name: Kylie Clark MRN: [id number] Date of Birth: Apr 28, 1932   Medicare Important Message Given:  Yes    Sherald Barge, RN 09/24/2016, 1:55 PM

## 2016-09-24 NOTE — Care Management Note (Signed)
Case Management Note  Patient Details  Name: Kylie Clark MRN: [id number] Date of Birth: Sep 02, 1931   Expected Discharge Date:    09/24/2016              Expected Discharge Plan:  [place]  In-House Referral:  Clinical Social Work, Hospice / Palliative Care  Discharge planning Services  CM Consult  Post Acute Care Choice:  NA Choice offered to:  NA  Status of Service:  Completed, signed off  Additional Comments: Pt discharging to [facility] today. CSW to make arrangements.   Sherald Barge, RN 09/24/2016, 1:56 PM

## 2016-09-24 NOTE — Progress Notes (Signed)
IV discontinued, catheter intact. Patient discharged to [facility],report called and given to [employer]. Family aware of transfer. Transported by [facility] services to awaiting facility.

## 2016-09-27 LAB — CULTURE, BLOOD (ROUTINE X 2)
Culture: NO GROWTH
Culture: NO GROWTH

## 2016-10-01 DIAGNOSIS — Z79899 Other long term (current) drug therapy: Secondary | ICD-10-CM | POA: Diagnosis not present

## 2016-10-01 DIAGNOSIS — E038 Other specified hypothyroidism: Secondary | ICD-10-CM | POA: Diagnosis not present

## 2016-10-01 DIAGNOSIS — D518 Other vitamin B12 deficiency anemias: Secondary | ICD-10-CM | POA: Diagnosis not present

## 2016-10-01 DIAGNOSIS — E1165 Type 2 diabetes mellitus with hyperglycemia: Secondary | ICD-10-CM | POA: Diagnosis not present

## 2016-10-01 DIAGNOSIS — E782 Mixed hyperlipidemia: Secondary | ICD-10-CM | POA: Diagnosis not present

## 2016-10-01 DIAGNOSIS — E559 Vitamin D deficiency, unspecified: Secondary | ICD-10-CM | POA: Diagnosis not present

## 2016-10-08 DEATH — deceased

## 2017-11-26 IMAGING — CR DG CHEST 1V PORT
1 series · 1 of 1 positions shown · non-contrast
Comparison: 09/01/2016

CLINICAL DATA: AMS, hypotensive

EXAM:
PORTABLE CHEST 1 VIEW

[portable]
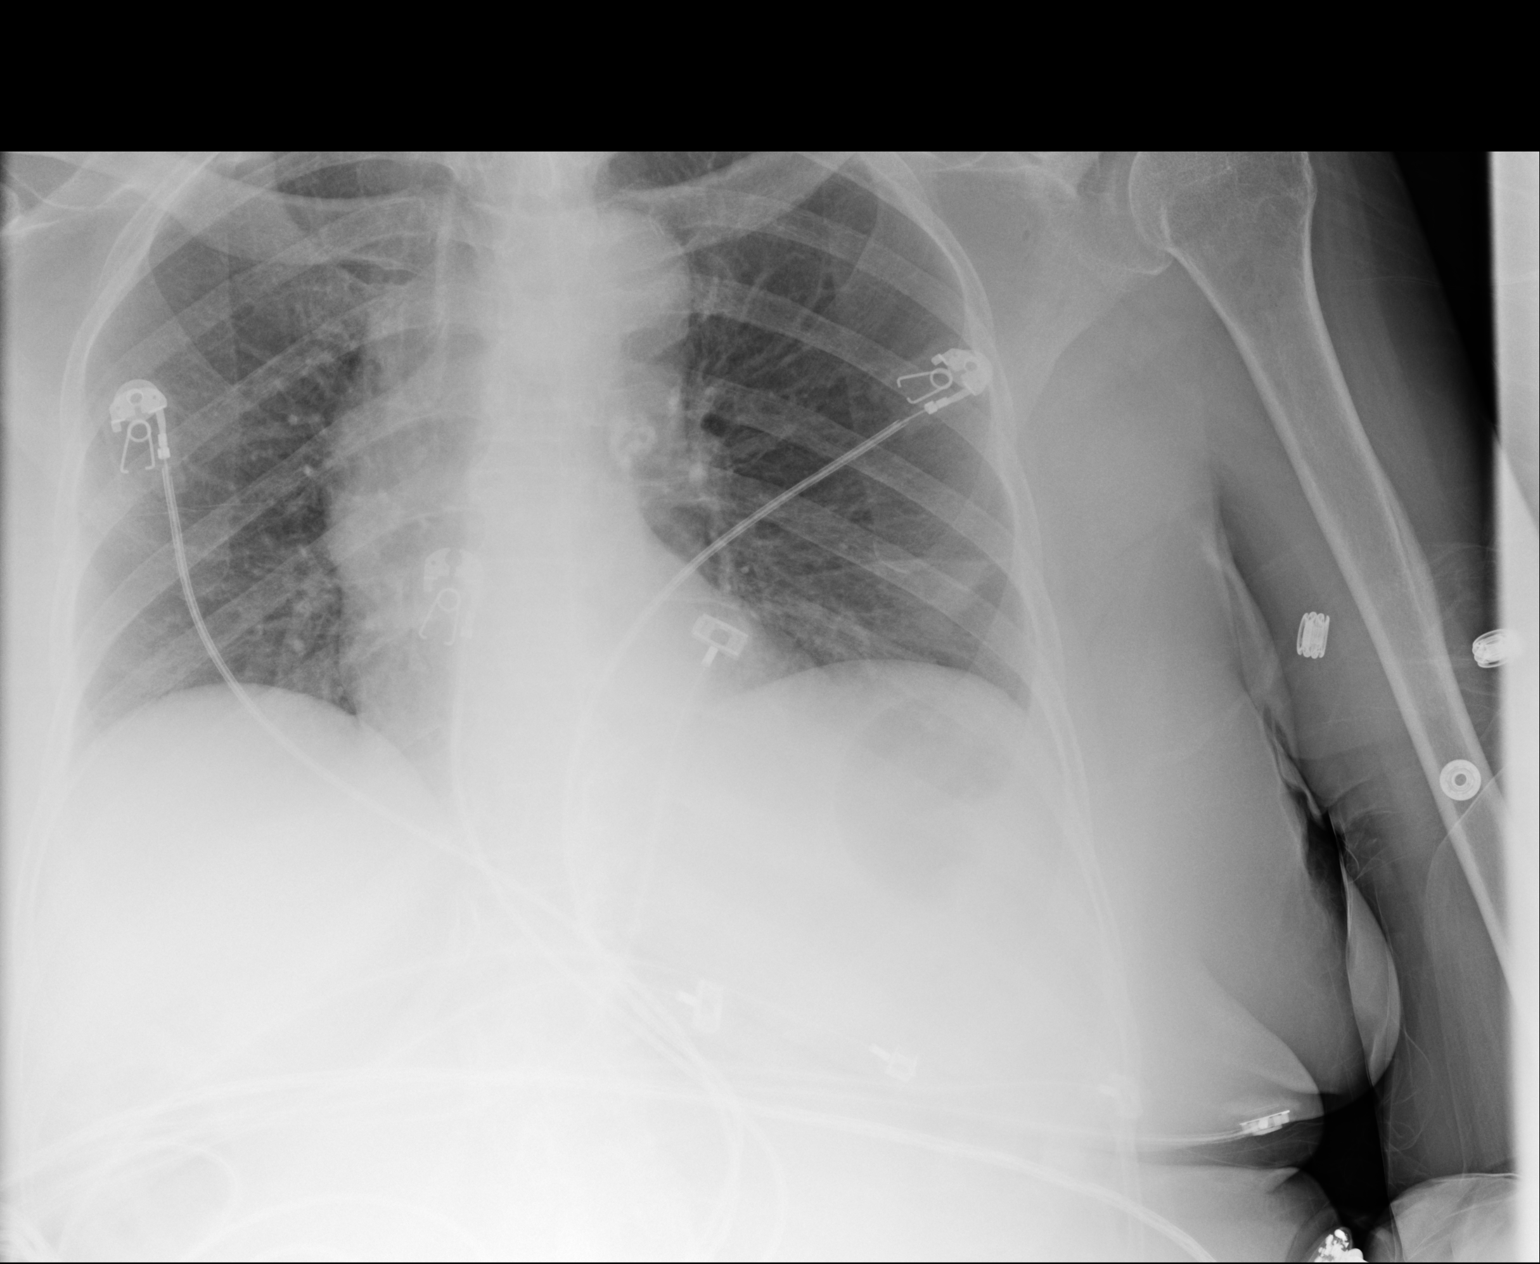

[1 of 1 positions shown; findings below may reference images not displayed]

FINDINGS: Midline trachea. Normal heart size. Patient mildly rotated to the
right. No pleural effusion or pneumothorax. Clear right lung. Patchy
left base airspace disease, similar.
IMPRESSION: Patchy left-sided airspace disease is similar and favored to
represent atelectasis.

Otherwise, no acute disease.

## 2017-11-26 IMAGING — CT CT HEAD W/O CM
3 series · 15 of 47 positions shown, 18 images · non-contrast
Comparison: 01/01/2014

CLINICAL DATA: 85-year-old female with altered mental status and
hypotension.

EXAM:
CT HEAD WITHOUT CONTRAST
TECHNIQUE: Contiguous axial images were obtained from the base of the skull
through the vertex without intravenous contrast.

[Series 2: head wo · axial · 0.45mm/px · z∈[+1366,+1501]mm · 9 of 33 slices shown, 12 images]
[im 3/33  brain]
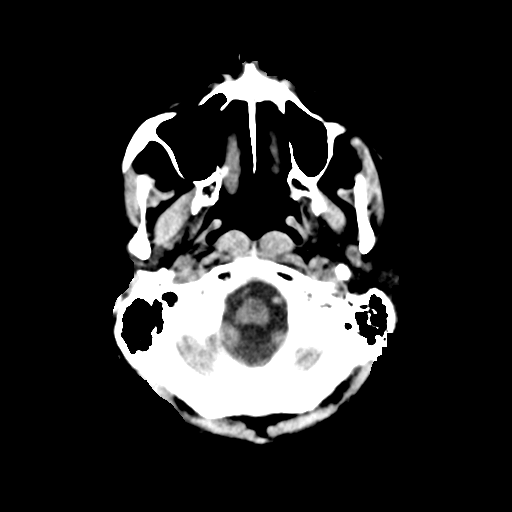
[im 3/33  bone]
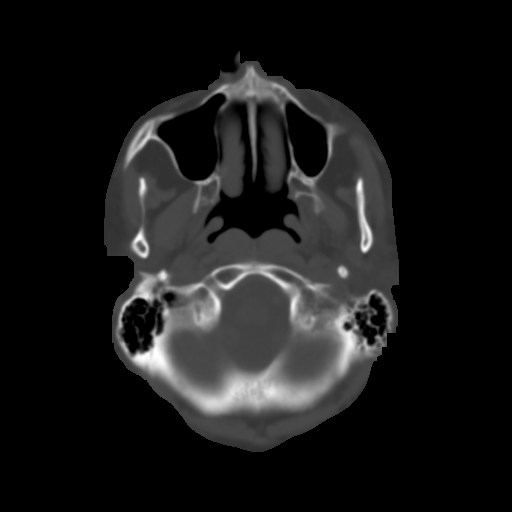
[im 6/33  brain]
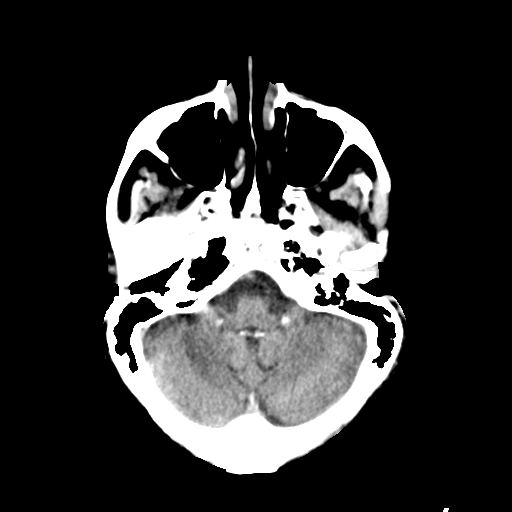
[im 9/33  brain]
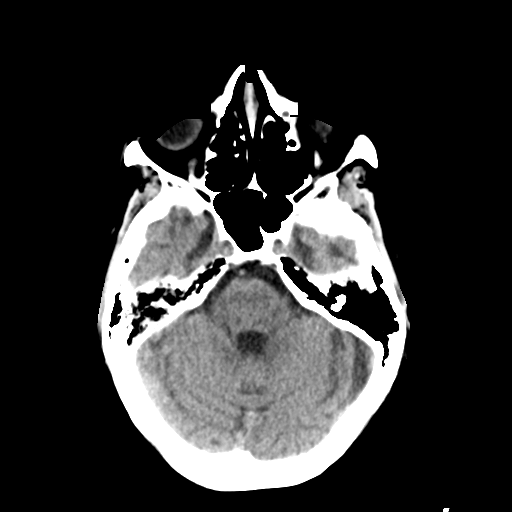
[im 13/33  brain]
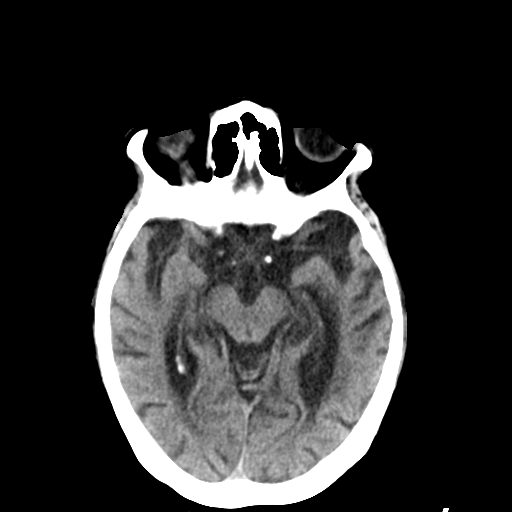
[im 17/33  brain]
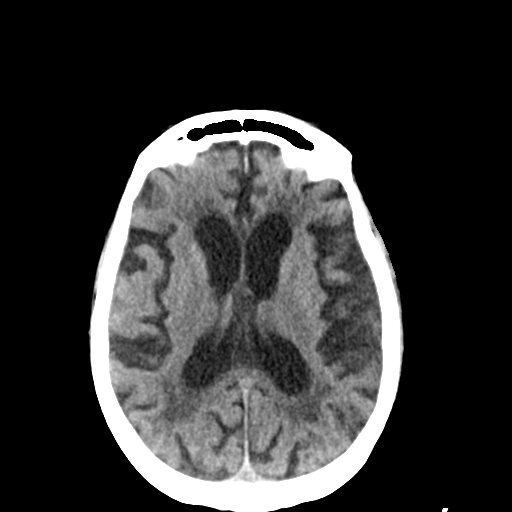
[im 17/33  bone]
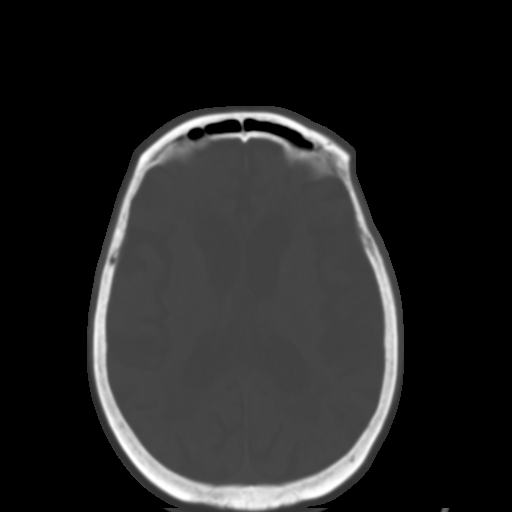
[im 20/33  brain]
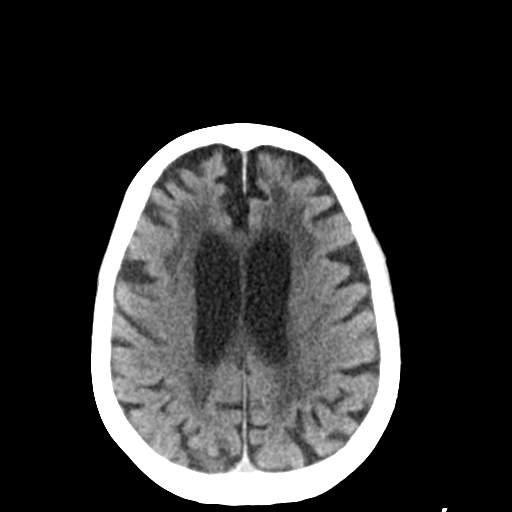
[im 24/33  brain]
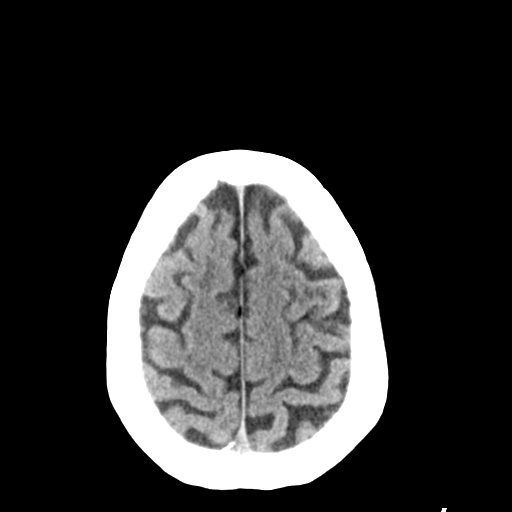
[im 27/33  brain]
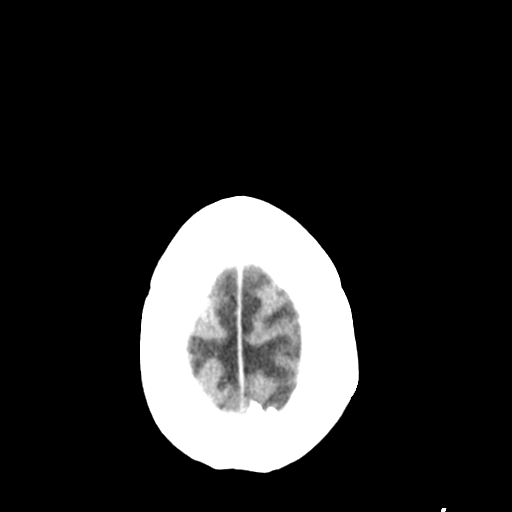
[im 30/33  brain]
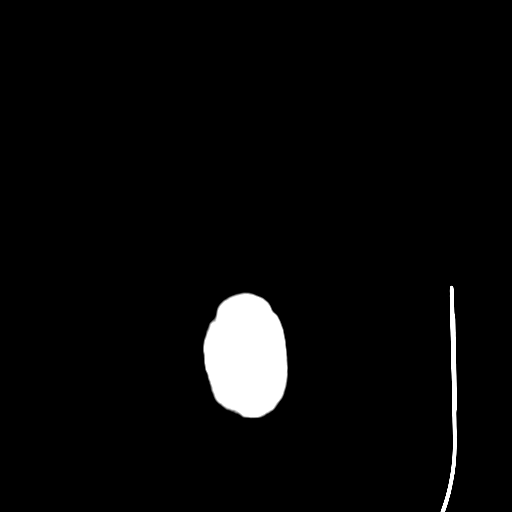
[im 30/33  bone]
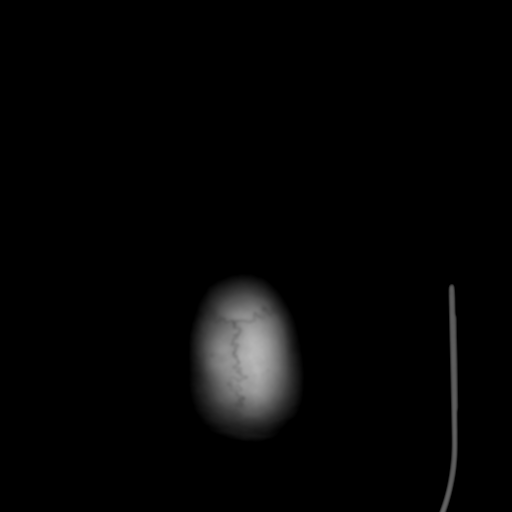

[Series 4: coronal soft tissue · coronal · 0.32mm/px · 3 of 67 slices shown]
[im 23/67  brain]
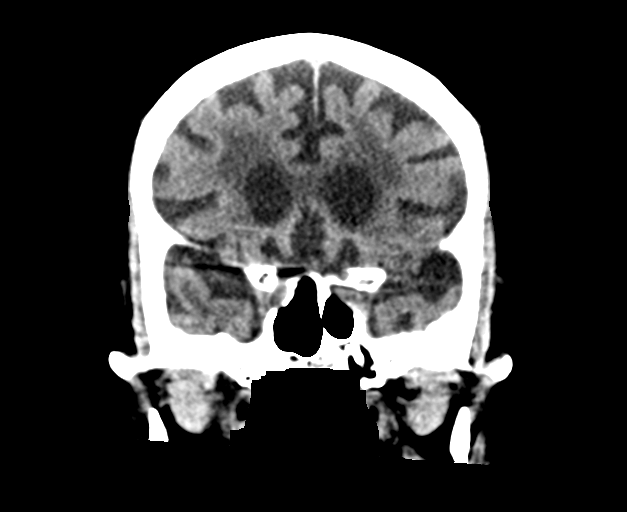
[im 30/67  brain]
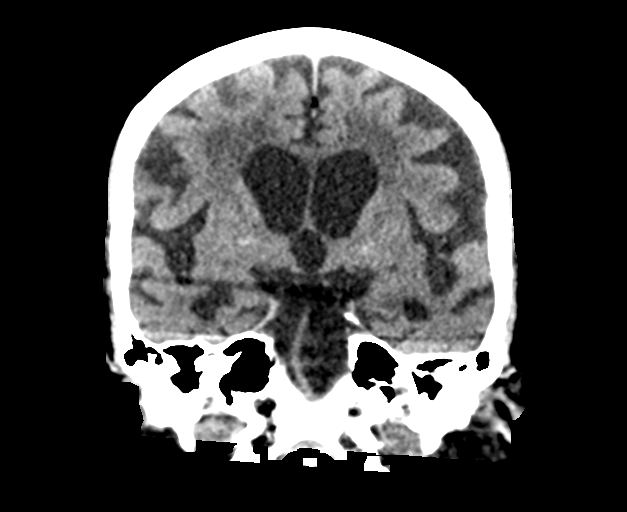
[im 37/67  brain]
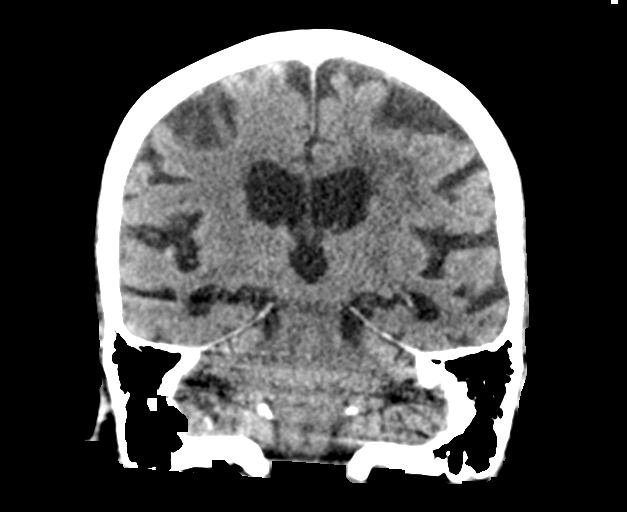

[Series 5: sagittal soft tissue · sagittal · 0.33mm/px · 3 of 67 slices shown]
[im 23/67  brain]
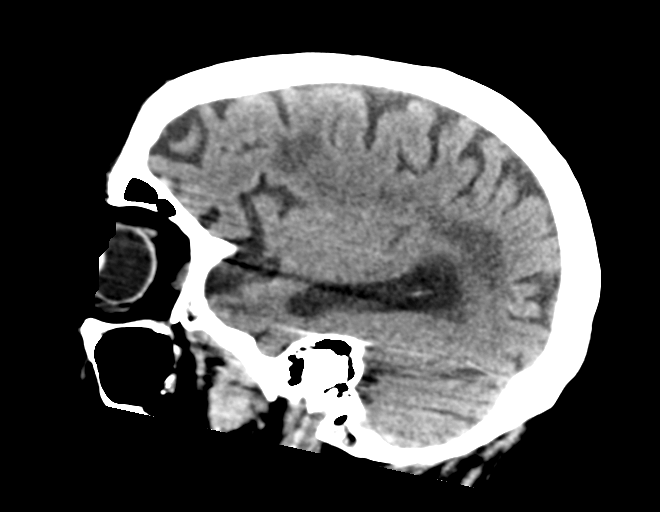
[im 34/67  brain]
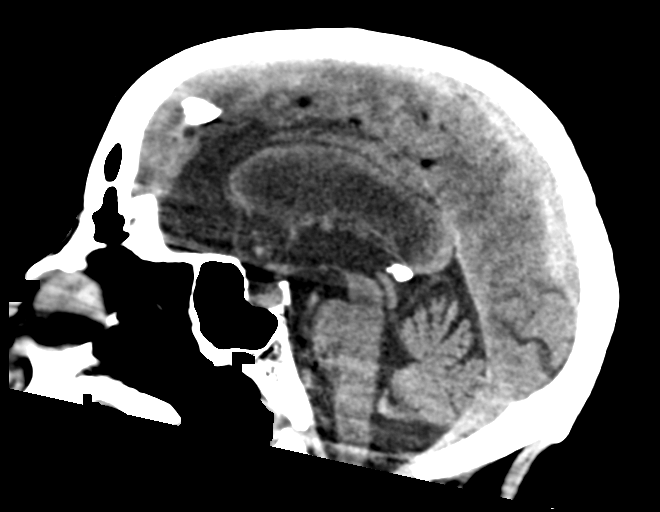
[im 45/67  brain]
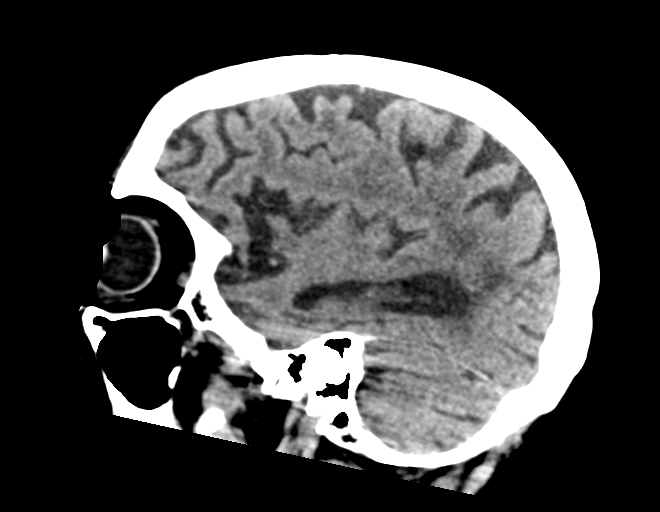

[15 of 47 positions shown; findings below may reference images not displayed]

FINDINGS: Brain: No evidence of acute infarction, hemorrhage, hydrocephalus,
extra-axial collection or mass lesion/mass effect.

Atrophy and chronic small-vessel white matter ischemic changes are
again noted.

Vascular: No hyperdense vessel or unexpected calcification.

Skull: Normal. Negative for fracture or focal lesion.

Sinuses/Orbits: No acute finding.

Other: None.
IMPRESSION: No evidence of acute intracranial abnormality.

Atrophy and chronic small-vessel white matter ischemic changes.
